# Patient Record
Sex: Female | Born: 1955 | Race: White | Hispanic: No | Marital: Single | Smoking: Former smoker
Health system: Southern US, Community
[De-identification: ages and names within clinical notes are randomized; demographics above are authoritative.]

## PROBLEM LIST (undated history)

## (undated) DIAGNOSIS — K146 Glossodynia: Secondary | ICD-10-CM

## (undated) DIAGNOSIS — I7381 Erythromelalgia: Secondary | ICD-10-CM

## (undated) DIAGNOSIS — M069 Rheumatoid arthritis, unspecified: Secondary | ICD-10-CM

## (undated) DIAGNOSIS — M797 Fibromyalgia: Secondary | ICD-10-CM

## (undated) HISTORY — PX: WRIST SURGERY: SHX841

## (undated) HISTORY — PX: FOOT SURGERY: SHX648

## (undated) HISTORY — DX: Erythromelalgia: I73.81

## (undated) HISTORY — PX: HERNIA REPAIR: SHX51

## (undated) HISTORY — PX: AUGMENTATION MAMMAPLASTY: SUR837

---

## 2014-04-08 DIAGNOSIS — F21 Schizotypal disorder: Secondary | ICD-10-CM | POA: Insufficient documentation

## 2014-04-08 DIAGNOSIS — F4010 Social phobia, unspecified: Secondary | ICD-10-CM | POA: Insufficient documentation

## 2015-11-27 DIAGNOSIS — F900 Attention-deficit hyperactivity disorder, predominantly inattentive type: Secondary | ICD-10-CM | POA: Insufficient documentation

## 2016-07-26 ENCOUNTER — Other Ambulatory Visit: Payer: Self-pay

## 2016-07-26 ENCOUNTER — Encounter (HOSPITAL_COMMUNITY): Payer: Self-pay | Admitting: *Deleted

## 2016-07-26 ENCOUNTER — Emergency Department (HOSPITAL_COMMUNITY): Payer: Medicare Other

## 2016-07-26 ENCOUNTER — Emergency Department (HOSPITAL_COMMUNITY)
Admission: EM | Admit: 2016-07-26 | Discharge: 2016-07-26 | Disposition: A | Discharge status: Home / Self Care | Payer: Medicare Other | Attending: Emergency Medicine | Admitting: Emergency Medicine

## 2016-07-26 DIAGNOSIS — F17200 Nicotine dependence, unspecified, uncomplicated: Secondary | ICD-10-CM | POA: Insufficient documentation

## 2016-07-26 DIAGNOSIS — R51 Headache: Secondary | ICD-10-CM | POA: Insufficient documentation

## 2016-07-26 DIAGNOSIS — Z5181 Encounter for therapeutic drug level monitoring: Secondary | ICD-10-CM | POA: Insufficient documentation

## 2016-07-26 DIAGNOSIS — Z79899 Other long term (current) drug therapy: Secondary | ICD-10-CM | POA: Diagnosis not present

## 2016-07-26 DIAGNOSIS — R519 Headache, unspecified: Secondary | ICD-10-CM

## 2016-07-26 HISTORY — DX: Fibromyalgia: M79.7

## 2016-07-26 HISTORY — DX: Rheumatoid arthritis, unspecified: M06.9

## 2016-07-26 HISTORY — DX: Glossodynia: K14.6

## 2016-07-26 LAB — I-STAT TROPONIN, ED: Troponin i, poc: 0 ng/mL (ref 0.00–0.08)

## 2016-07-26 LAB — DIFFERENTIAL
Basophils Absolute: 0 10*3/uL (ref 0.0–0.1)
Basophils Relative: 0 %
Eosinophils Absolute: 0.1 10*3/uL (ref 0.0–0.7)
Eosinophils Relative: 1 %
LYMPHS ABS: 2.6 10*3/uL (ref 0.7–4.0)
LYMPHS PCT: 40 %
Monocytes Absolute: 0.4 10*3/uL (ref 0.1–1.0)
Monocytes Relative: 6 %
NEUTROS ABS: 3.5 10*3/uL (ref 1.7–7.7)
NEUTROS PCT: 53 %

## 2016-07-26 LAB — COMPREHENSIVE METABOLIC PANEL
ALBUMIN: 4.1 g/dL (ref 3.5–5.0)
ALK PHOS: 139 U/L — AB (ref 38–126)
ALT: 21 U/L (ref 14–54)
AST: 23 U/L (ref 15–41)
Anion gap: 7 (ref 5–15)
BILIRUBIN TOTAL: 0.6 mg/dL (ref 0.3–1.2)
BUN: 15 mg/dL (ref 6–20)
CALCIUM: 9.6 mg/dL (ref 8.9–10.3)
CO2: 29 mmol/L (ref 22–32)
CREATININE: 0.59 mg/dL (ref 0.44–1.00)
Chloride: 106 mmol/L (ref 101–111)
GFR calc Af Amer: >60 mL/min (ref >60)
GFR calc non Af Amer: >60 mL/min (ref >60)
GLUCOSE: 105 mg/dL — AB (ref 65–99)
Potassium: 4.8 mmol/L (ref 3.5–5.1)
SODIUM: 142 mmol/L (ref 135–145)
Total Protein: 6.6 g/dL (ref 6.5–8.1)

## 2016-07-26 LAB — PROTIME-INR
INR: 0.91
PROTHROMBIN TIME: 12.2 s (ref 11.4–15.2)

## 2016-07-26 LAB — CBC
HCT: 43.3 % (ref 36.0–46.0)
HEMOGLOBIN: 14.2 g/dL (ref 12.0–15.0)
MCH: 32.9 pg (ref 26.0–34.0)
MCHC: 32.8 g/dL (ref 30.0–36.0)
MCV: 100.2 fL — AB (ref 78.0–100.0)
PLATELETS: 304 10*3/uL (ref 150–400)
RBC: 4.32 MIL/uL (ref 3.87–5.11)
RDW: 14.5 % (ref 11.5–15.5)
WBC: 6.6 10*3/uL (ref 4.0–10.5)

## 2016-07-26 LAB — CBG MONITORING, ED: Glucose-Capillary: 88 mg/dL (ref 65–99)

## 2016-07-26 LAB — POC URINE PREG, ED: PREG TEST UR: NEGATIVE

## 2016-07-26 LAB — APTT: aPTT: 29 seconds (ref 24–36)

## 2016-07-26 MED ORDER — DIPHENHYDRAMINE HCL 50 MG/ML IJ SOLN
25.0000 mg | Freq: Once | INTRAMUSCULAR | Status: AC
  Administered 2016-07-26: 25 mg via INTRAVENOUS
  Filled 2016-07-26: qty 1

## 2016-07-26 MED ORDER — SODIUM CHLORIDE 0.9 % IV BOLUS (SEPSIS)
1000.0000 mL | Freq: Once | INTRAVENOUS | Status: AC
  Administered 2016-07-26: 1000 mL via INTRAVENOUS

## 2016-07-26 MED ORDER — KETOROLAC TROMETHAMINE 15 MG/ML IJ SOLN
15.0000 mg | Freq: Once | INTRAMUSCULAR | Status: AC
  Administered 2016-07-26: 15 mg via INTRAVENOUS
  Filled 2016-07-26: qty 1

## 2016-07-26 MED ORDER — METOCLOPRAMIDE HCL 5 MG/ML IJ SOLN
10.0000 mg | Freq: Once | INTRAMUSCULAR | Status: AC
  Administered 2016-07-26: 10 mg via INTRAVENOUS
  Filled 2016-07-26: qty 2

## 2016-07-26 NOTE — ED Notes (Signed)
On way to CT 

## 2016-07-26 NOTE — ED Triage Notes (Addendum)
PT recently moved here and is here with headache for the last 5-7 days and feels like pain shifts from one side to the other.  Pt states she had some intermittent dizziness and sensitive to light and noise.  NO extremity or facial deficits.  Pt needs specific labs drawn so she can get her medications since she just moved here.  Feels like when she is walking her gait is off

## 2016-07-26 NOTE — ED Provider Notes (Signed)
[facility] DEPT Provider Note   CSN: [id number] Arrival date & time: 07/26/16  1033     History   Chief Complaint Chief Complaint  Patient presents with  . Headache  . Dizziness    HPI Krista Harris is a 61 y.o. female.  The history is provided by the patient.  Headache   This is a new problem. Episode onset: about 1 week. The problem occurs constantly. The problem has not changed since onset.The headache is associated with nothing. Pain location: "moving all around and feels like there is sloshing in my head" The quality of the pain is described as dull and throbbing. The pain is moderate. The pain does not radiate. Pertinent negatives include no fever, no near-syncope, no shortness of breath, no nausea and no vomiting. She has tried nothing for the symptoms. The treatment provided no relief.    Past Medical History:  Diagnosis Date  . BMS (burning mouth syndrome)   . Fibromyalgia   . Rheumatoid arthritis ([place])     There are no active problems to display for this patient.   Past Surgical History:  Procedure Laterality Date  . WRIST SURGERY      OB History    No data available       Home Medications    Prior to Admission medications   Medication Sig Start Date End Date Taking? Authorizing Provider  acetaminophen (TYLENOL) 325 MG tablet Take 650 mg by mouth every 6 (six) hours as needed for mild pain.   Yes [provider]  Cholecalciferol (VITAMIN D) 2000 units tablet Take 2,000 Units by mouth daily. 07/19/16  Yes [provider]  clonazePAM (KLONOPIN) 0.5 MG tablet Take 0.5 mg by mouth 2 (two) times daily as needed for anxiety. Burning mouth 06/23/16  Yes [provider]  folic acid (FOLVITE) 1 MG tablet Take 1 mg by mouth daily.   Yes [provider]  gabapentin (NEURONTIN) 600 MG tablet Take 600 mg by mouth 3 (three) times daily. 06/23/16  Yes [provider]  methotrexate (RHEUMATREX) 2.5 MG tablet Take 20 mg  by mouth once a week. Wednesdays 07/19/16  Yes [provider]  predniSONE (DELTASONE) 5 MG tablet Take 10 mg by mouth daily.  07/18/16  Yes [provider]    Family History No family history on file.  Social History Social History  Substance Use Topics  . Smoking status: Current Every Day Smoker  . Smokeless tobacco: Never Used  . Alcohol use No     Allergies   Patient has no known allergies.   Review of Systems Review of Systems  Constitutional: Negative for fever.  HENT: Negative.   Respiratory: Negative for shortness of breath.   Cardiovascular: Negative for chest pain and near-syncope.  Gastrointestinal: Negative for nausea and vomiting.  Genitourinary: Negative.   Neurological: Positive for headaches.  All other systems reviewed and are negative.    Physical Exam Updated Vital Signs BP 117/73   Pulse 71   Temp 97.6 F (36.4 C) (Oral)   Resp 13   SpO2 95%   Physical Exam  Constitutional: She is oriented to person, place, and time. She appears well-developed and well-nourished. No distress.  HENT:  Head: Normocephalic and atraumatic.  Mouth/Throat: Oropharynx is clear and moist.  Eyes: Conjunctivae and EOM are normal. Pupils are equal, round, and reactive to light.  Neck: Normal range of motion. Neck supple.  Cardiovascular: Normal rate and regular rhythm.   No murmur heard. Pulmonary/Chest:  Effort normal and breath sounds normal. No respiratory distress.  Abdominal: Soft. There is no tenderness.  Musculoskeletal: She exhibits no edema or deformity.  Neurological: She is alert and oriented to person, place, and time. She has normal strength. No cranial nerve deficit or sensory deficit. She exhibits normal muscle tone. Coordination normal. GCS eye subscore is 4. GCS verbal subscore is 5. GCS motor subscore is 6.  Skin: Skin is warm and dry.  Psychiatric: She has a normal mood and affect.  Nursing note and vitals reviewed.    ED  Treatments / Results  Labs (all labs ordered are listed, but only abnormal results are displayed) Labs Reviewed  CBC - Abnormal; Notable for the following:       Result Value   MCV 100.2 (*)    All other components within normal limits  COMPREHENSIVE METABOLIC PANEL - Abnormal; Notable for the following:    Glucose, Bld 105 (*)    Alkaline Phosphatase 139 (*)    All other components within normal limits  PROTIME-INR  APTT  DIFFERENTIAL  I-STAT TROPOININ, ED  CBG MONITORING, ED  I-STAT CHEM 8, ED  POC URINE PREG, ED    EKG  EKG Interpretation None       Radiology Ct Head Wo Contrast  Result Date: 07/26/2016 CLINICAL DATA:  Intermittent dizziness and sensitivity to light and weight, headaches. EXAM: CT HEAD WITHOUT CONTRAST TECHNIQUE: Contiguous axial images were obtained from the base of the skull through the vertex without intravenous contrast. COMPARISON:  None. FINDINGS: Brain: No evidence of acute infarction, hemorrhage, hydrocephalus, extra-axial collection or mass lesion/mass effect. Vascular: No hyperdense vessel or unexpected calcification. Skull: Normal. Negative for fracture or focal lesion. Sinuses/Orbits: No acute finding. Other: None. IMPRESSION: No focal acute intracranial abnormality identified. Electronically Signed   By: Abelardo Diesel M.D.   On: 07/26/2016 17:59    Procedures Procedures (including critical care time)  Medications Ordered in ED Medications  sodium chloride 0.9 % bolus 1,000 mL (0 mLs Intravenous Stopped 07/26/16 1731)  metoCLOPramide (REGLAN) injection 10 mg (10 mg Intravenous Given 07/26/16 1621)  diphenhydrAMINE (BENADRYL) injection 25 mg (25 mg Intravenous Given 07/26/16 1621)  ketorolac (TORADOL) 15 MG/ML injection 15 mg (15 mg Intravenous Given 07/26/16 1621)     Initial Impression / Assessment and Plan / ED Course  I have reviewed the triage vital signs and the nursing notes.  Pertinent labs & imaging results that were available during my  care of the patient were reviewed by me and considered in my medical decision making (see chart for details).     Patient is a 61 year old female who presents with headache for the past one week. No history of headaches in the past. Vital signs are unremarkable exam without acute neurologic findings. No suspicion for infection at this time. No stroke findings. Given no history of headaches or get a head CT. Migraine cocktail given with complete resolution of pain. Labs unremarkable and head CT unremarkable.  I have reviewed all labs and imaging. Patient stable for discharge home.  I have reviewed all results with the patient. Advised to f/u with a pcp within 1 week. Patient agrees to stated plan. All questions answered. Advised to call or return to have any questions, new symptoms, change in symptoms, or symptoms that they do not understand.   Final Clinical Impressions(s) / ED Diagnoses   Final diagnoses:  Nonintractable headache, unspecified chronicity pattern, unspecified headache type    New Prescriptions Discharge Medication List as  of 07/26/2016  6:21 PM       Heriberto Antigua, MD 07/27/16 2575    Veryl Speak, MD 07/27/16 1544

## 2016-11-30 ENCOUNTER — Other Ambulatory Visit (HOSPITAL_COMMUNITY): Payer: Self-pay | Admitting: Internal Medicine

## 2016-11-30 DIAGNOSIS — M069 Rheumatoid arthritis, unspecified: Secondary | ICD-10-CM

## 2016-12-01 ENCOUNTER — Ambulatory Visit (HOSPITAL_COMMUNITY)
Admission: RE | Admit: 2016-12-01 | Discharge: 2016-12-01 | Disposition: A | Discharge status: Home / Self Care | Payer: Medicare Other | Source: Ambulatory Visit | Attending: Internal Medicine | Admitting: Internal Medicine

## 2016-12-01 ENCOUNTER — Encounter (HOSPITAL_COMMUNITY): Payer: Self-pay | Admitting: Radiology

## 2016-12-01 DIAGNOSIS — M1288 Other specific arthropathies, not elsewhere classified, other specified site: Secondary | ICD-10-CM | POA: Insufficient documentation

## 2016-12-01 DIAGNOSIS — M069 Rheumatoid arthritis, unspecified: Secondary | ICD-10-CM | POA: Insufficient documentation

## 2017-01-13 ENCOUNTER — Emergency Department (HOSPITAL_COMMUNITY)
Admission: EM | Admit: 2017-01-13 | Discharge: 2017-01-13 | Disposition: A | Discharge status: Home / Self Care | Payer: Medicare Other | Attending: Emergency Medicine | Admitting: Emergency Medicine

## 2017-01-13 ENCOUNTER — Encounter (HOSPITAL_COMMUNITY): Payer: Self-pay | Admitting: Emergency Medicine

## 2017-01-13 DIAGNOSIS — Z79899 Other long term (current) drug therapy: Secondary | ICD-10-CM | POA: Diagnosis not present

## 2017-01-13 DIAGNOSIS — F17210 Nicotine dependence, cigarettes, uncomplicated: Secondary | ICD-10-CM | POA: Diagnosis not present

## 2017-01-13 DIAGNOSIS — Y929 Unspecified place or not applicable: Secondary | ICD-10-CM | POA: Insufficient documentation

## 2017-01-13 DIAGNOSIS — S3992XA Unspecified injury of lower back, initial encounter: Secondary | ICD-10-CM | POA: Diagnosis present

## 2017-01-13 DIAGNOSIS — M545 Low back pain, unspecified: Secondary | ICD-10-CM

## 2017-01-13 DIAGNOSIS — X500XXA Overexertion from strenuous movement or load, initial encounter: Secondary | ICD-10-CM | POA: Insufficient documentation

## 2017-01-13 DIAGNOSIS — Y9389 Activity, other specified: Secondary | ICD-10-CM | POA: Diagnosis not present

## 2017-01-13 DIAGNOSIS — Y999 Unspecified external cause status: Secondary | ICD-10-CM | POA: Insufficient documentation

## 2017-01-13 DIAGNOSIS — S39012A Strain of muscle, fascia and tendon of lower back, initial encounter: Secondary | ICD-10-CM | POA: Diagnosis not present

## 2017-01-13 MED ORDER — DEXAMETHASONE 4 MG PO TABS: 4.0000 mg | ORAL_TABLET | Freq: Two times a day (BID) | ORAL | 0 refills | Status: DC

## 2017-01-13 MED ORDER — DICLOFENAC SODIUM 75 MG PO TBEC
75.0000 mg | DELAYED_RELEASE_TABLET | Freq: Two times a day (BID) | ORAL | 0 refills | Status: DC
Start: 2017-01-13 — End: 2017-04-05

## 2017-01-13 MED ORDER — CYCLOBENZAPRINE HCL 10 MG PO TABS: 10.0000 mg | ORAL_TABLET | Freq: Three times a day (TID) | ORAL | 0 refills | Status: DC

## 2017-01-13 NOTE — ED Provider Notes (Signed)
[facility] EMERGENCY DEPARTMENT Provider Note   CSN: [id number] Arrival date & time: 01/13/17  1414     History   Chief Complaint Chief Complaint  Patient presents with  . Back Pain    HPI Krista Harris is a 61 y.o. female.  Patient is a 61 year old female who presents to the emergency department with a complaint of lower back area pain.  The patient states that approximately 1 month ago she was doing some moving, she thinks she pulled something in her back.  She has been having problems with her back since that time.  The patient states that she has rheumatoid arthritis as well as fibromyalgia and she has some "brittle bones" .  Patient states that the pain seems to be getting worse instead of getting better in spite of conservative measures.  She does not have a primary physician as she is recently moved into this area.  She presents to the emergency department for assistance and evaluation.   The history is provided by the patient.  Back Pain   Pertinent negatives include no chest pain, no abdominal pain and no dysuria.    Past Medical History:  Diagnosis Date  . BMS (burning mouth syndrome)   . Fibromyalgia   . Rheumatoid arthritis ([place])     There are no active problems to display for this patient.   Past Surgical History:  Procedure Laterality Date  . WRIST SURGERY      OB History    No data available       Home Medications    Prior to Admission medications   Medication Sig Start Date End Date Taking? Authorizing Provider  acetaminophen (TYLENOL) 325 MG tablet Take 650 mg by mouth every 6 (six) hours as needed for mild pain.    [provider]  Cholecalciferol (VITAMIN D) 2000 units tablet Take 2,000 Units by mouth daily. 07/19/16   [provider]  clonazePAM (KLONOPIN) 0.5 MG tablet Take 0.5 mg by mouth 2 (two) times daily as needed for anxiety. Burning mouth 06/23/16   [provider]  folic acid (FOLVITE) 1 MG tablet  Take 1 mg by mouth daily.    [provider]  gabapentin (NEURONTIN) 600 MG tablet Take 600 mg by mouth 3 (three) times daily. 06/23/16   [provider]  methotrexate (RHEUMATREX) 2.5 MG tablet Take 20 mg by mouth once a week. Wednesdays 07/19/16   [provider]  predniSONE (DELTASONE) 5 MG tablet Take 10 mg by mouth daily.  07/18/16   [provider]    Family History No family history on file.  Social History Social History  Substance Use Topics  . Smoking status: Current Every Day Smoker    Packs/day: 0.50    Types: Cigarettes  . Smokeless tobacco: Never Used  . Alcohol use No     Allergies   Patient has no known allergies.   Review of Systems Review of Systems  Constitutional: Negative for activity change.       All ROS Neg except as noted in HPI  HENT: Negative for nosebleeds.   Eyes: Negative for photophobia and discharge.  Respiratory: Negative for cough, shortness of breath and wheezing.   Cardiovascular: Negative for chest pain and palpitations.  Gastrointestinal: Negative for abdominal pain and blood in stool.  Genitourinary: Negative for dysuria, frequency and hematuria.  Musculoskeletal: Positive for arthralgias and back pain. Negative for neck pain.  Skin: Negative.   Neurological: Negative for dizziness, seizures and  speech difficulty.  Psychiatric/Behavioral: Negative for confusion and hallucinations.     Physical Exam Updated Vital Signs BP 119/79 (BP Location: Right Arm)   Pulse 82   Temp 97.6 F (36.4 C) (Oral)   Resp 15   Ht 5\' 3"  (1.6 m)   Wt 48.5 kg (107 lb)   SpO2 100%   BMI 18.95 kg/m   Physical Exam  Constitutional: She is oriented to person, place, and time. She appears well-developed and well-nourished.  Non-toxic appearance.  HENT:  Head: Normocephalic.  Right Ear: Tympanic membrane and external ear normal.  Left Ear: Tympanic membrane and external ear normal.  Eyes: Pupils are equal, round, and  reactive to light. EOM and lids are normal.  Neck: Normal range of motion. Neck supple. Carotid bruit is not present.  Cardiovascular: Normal rate, regular rhythm, normal heart sounds, intact distal pulses and normal pulses.   Pulmonary/Chest: Breath sounds normal. No respiratory distress.  Abdominal: Soft. Bowel sounds are normal. There is no tenderness. There is no guarding.  Musculoskeletal: Normal range of motion.       Lumbar back: She exhibits pain and spasm.       Back:  Patient has a brace on the right hand and wrist area.  Lymphadenopathy:       Head (right side): No submandibular adenopathy present.       Head (left side): No submandibular adenopathy present.    She has no cervical adenopathy.  Neurological: She is alert and oriented to person, place, and time. She has normal strength. No cranial nerve deficit or sensory deficit.  Gait is steady.  No foot drop noted.  No motor or sensory deficits appreciated of the lower extremity.  Skin: Skin is warm and dry.  Psychiatric: She has a normal mood and affect. Her speech is normal.  Nursing note and vitals reviewed.    ED Treatments / Results  Labs (all labs ordered are listed, but only abnormal results are displayed) Labs Reviewed - No data to display  EKG  EKG Interpretation None       Radiology No results found.  Procedures Procedures (including critical care time)  Medications Ordered in ED Medications - No data to display   Initial Impression / Assessment and Plan / ED Course  I have reviewed the triage vital signs and the nursing notes.  Pertinent labs & imaging results that were available during my care of the patient were reviewed by me and considered in my medical decision making (see chart for details).       Final Clinical Impressions(s) / ED Diagnoses MDM Vital signs within normal limits.  No gross neurologic deficits appreciated at this time.  Patient states that some years ago she thinks she  was told that she had some degenerative disc disease changes.  The examination today favors some muscle strain, as well as some musculoskeletal pain in the lumbar region.  The patient will be treated with Flexeril, Decadron, and diclofenac.  Patient is referred to orthopedics for additional treatment and diagnosis.   Final diagnoses:  Strain of lumbar region, initial encounter  Midline low back pain without sciatica, unspecified chronicity    New Prescriptions New Prescriptions   CYCLOBENZAPRINE (FLEXERIL) 10 MG TABLET    Take 1 tablet (10 mg total) by mouth 3 (three) times daily.   DEXAMETHASONE (DECADRON) 4 MG TABLET    Take 1 tablet (4 mg total) by mouth 2 (two) times daily with a meal.   DICLOFENAC (VOLTAREN) 75  MG EC TABLET    Take 1 tablet (75 mg total) by mouth 2 (two) times daily.     Lily Kocher, PA-C 01/13/17 [place], [place], PA-C 01/13/17 1513    Fredia Sorrow, MD 01/14/17 [phone]

## 2017-01-13 NOTE — ED Triage Notes (Signed)
Patient complains of lower back pain after dumping over a barrel x 1 month ago. She states the pain is radiating up right side of lower back.

## 2017-01-13 NOTE — Discharge Instructions (Signed)
Your vital signs are within normal limits at this time.  Your neurologic examination is normal and does not reveal any deficits at this time.  The examination of your lower back reveals pain with various movements and palpation.  Please use a heating pad to your lower back when at rest.  Please use Flexeril, Decadron, and diclofenac daily.  Please take the Decadron and diclofenac with food.  Flexeril may cause drowsiness.  Please do not drive, operate machinery, lift heavy objects, drink alcohol, or participate in activities requiring concentration when taking this medication.  Please see Dr. Percell Miller or member of his team for further evaluation and management of your back issue.

## 2017-03-23 ENCOUNTER — Other Ambulatory Visit: Payer: Self-pay

## 2017-03-23 ENCOUNTER — Encounter (HOSPITAL_COMMUNITY): Payer: Self-pay | Admitting: *Deleted

## 2017-03-23 ENCOUNTER — Emergency Department (HOSPITAL_COMMUNITY)
Admission: EM | Admit: 2017-03-23 | Discharge: 2017-03-23 | Disposition: A | Discharge status: Home / Self Care | Payer: Medicare Other | Attending: Emergency Medicine | Admitting: Emergency Medicine

## 2017-03-23 DIAGNOSIS — F17210 Nicotine dependence, cigarettes, uncomplicated: Secondary | ICD-10-CM | POA: Insufficient documentation

## 2017-03-23 DIAGNOSIS — Z79899 Other long term (current) drug therapy: Secondary | ICD-10-CM | POA: Insufficient documentation

## 2017-03-23 DIAGNOSIS — R197 Diarrhea, unspecified: Secondary | ICD-10-CM | POA: Insufficient documentation

## 2017-03-23 LAB — COMPREHENSIVE METABOLIC PANEL
ALT: 21 U/L (ref 14–54)
AST: 23 U/L (ref 15–41)
Albumin: 4.2 g/dL (ref 3.5–5.0)
Alkaline Phosphatase: 115 U/L (ref 38–126)
Anion gap: 12 (ref 5–15)
BILIRUBIN TOTAL: 0.7 mg/dL (ref 0.3–1.2)
BUN: 14 mg/dL (ref 6–20)
CHLORIDE: 106 mmol/L (ref 101–111)
CO2: 25 mmol/L (ref 22–32)
CREATININE: 0.56 mg/dL (ref 0.44–1.00)
Calcium: 9.7 mg/dL (ref 8.9–10.3)
Glucose, Bld: 95 mg/dL (ref 65–99)
POTASSIUM: 4.2 mmol/L (ref 3.5–5.1)
Sodium: 143 mmol/L (ref 135–145)
TOTAL PROTEIN: 7.4 g/dL (ref 6.5–8.1)

## 2017-03-23 LAB — CBC
HEMATOCRIT: 45 % (ref 36.0–46.0)
Hemoglobin: 14.3 g/dL (ref 12.0–15.0)
MCH: 32.9 pg (ref 26.0–34.0)
MCHC: 31.8 g/dL (ref 30.0–36.0)
MCV: 103.7 fL — ABNORMAL HIGH (ref 78.0–100.0)
PLATELETS: 331 10*3/uL (ref 150–400)
RBC: 4.34 MIL/uL (ref 3.87–5.11)
RDW: 13.3 % (ref 11.5–15.5)
WBC: 6.8 10*3/uL (ref 4.0–10.5)

## 2017-03-23 LAB — URINALYSIS, ROUTINE W REFLEX MICROSCOPIC
Bilirubin Urine: NEGATIVE
GLUCOSE, UA: NEGATIVE mg/dL
Hgb urine dipstick: NEGATIVE
Ketones, ur: NEGATIVE mg/dL
LEUKOCYTES UA: NEGATIVE
NITRITE: NEGATIVE
Protein, ur: NEGATIVE mg/dL
Specific Gravity, Urine: 1.011 (ref 1.005–1.030)
pH: 5 (ref 5.0–8.0)

## 2017-03-23 LAB — LIPASE, BLOOD: LIPASE: 28 U/L (ref 11–51)

## 2017-03-23 MED ORDER — DICYCLOMINE HCL 20 MG PO TABS: 20.0000 mg | ORAL_TABLET | Freq: Two times a day (BID) | ORAL | 0 refills | Status: DC

## 2017-03-23 NOTE — ED Triage Notes (Signed)
Pt c/o watery diarrhea since June. Pt reports she was put on some medicine back in June for colitis and possible E. Coli infection. Pt recently moved down to [place] and is trying to get in with GI in this area. Pt reports 35 episodes of diarrhea in the last 24 hours, some very small and some larger per pt. Pt reports chills.

## 2017-03-24 NOTE — ED Provider Notes (Signed)
[facility] EMERGENCY DEPARTMENT Provider Note   CSN: [id number] Arrival date & time: 03/23/17  1343     History   Chief Complaint Chief Complaint  Patient presents with  . Diarrhea    HPI Krista Harris is a 62 y.o. female.   Diarrhea   This is a chronic problem. The current episode started 2 days ago. The problem occurs more than 10 times per day. The problem has not changed since onset.The stool consistency is described as watery and mucous. There has been no fever. Associated symptoms include abdominal pain. Pertinent negatives include no vomiting, no chills and no cough. She has tried nothing for the symptoms.    Past Medical History:  Diagnosis Date  . BMS (burning mouth syndrome)   . Fibromyalgia   . Rheumatoid arthritis ([place])     There are no active problems to display for this patient.   Past Surgical History:  Procedure Laterality Date  . FOOT SURGERY Bilateral    for rheumatoid arthritis  . HERNIA REPAIR    . WRIST SURGERY      OB History    No data available       Home Medications    Prior to Admission medications   Medication Sig Start Date End Date Taking? Authorizing Provider  acetaminophen (TYLENOL) 325 MG tablet Take 650 mg by mouth every 6 (six) hours as needed for mild pain.    [provider]  Cholecalciferol (VITAMIN D) 2000 units tablet Take 2,000 Units by mouth daily. 07/19/16   [provider]  clonazePAM (KLONOPIN) 0.5 MG tablet Take 0.5 mg by mouth 2 (two) times daily as needed for anxiety. Burning mouth 06/23/16   [provider]  cyclobenzaprine (FLEXERIL) 10 MG tablet Take 1 tablet (10 mg total) by mouth 3 (three) times daily. 01/13/17   Lily Kocher, PA-C  dexamethasone (DECADRON) 4 MG tablet Take 1 tablet (4 mg total) by mouth 2 (two) times daily with a meal. 01/13/17   Lily Kocher, PA-C  diclofenac (VOLTAREN) 75 MG EC tablet Take 1 tablet (75 mg total) by mouth 2 (two) times daily. 01/13/17    Lily Kocher, PA-C  dicyclomine (BENTYL) 20 MG tablet Take 1 tablet (20 mg total) by mouth 2 (two) times daily. 03/23/17   Sairah Knobloch, Corene Cornea, MD  folic acid (FOLVITE) 1 MG tablet Take 1 mg by mouth daily.    [provider]  gabapentin (NEURONTIN) 600 MG tablet Take 600 mg by mouth 3 (three) times daily. 06/23/16   [provider]  methotrexate (RHEUMATREX) 2.5 MG tablet Take 20 mg by mouth once a week. Wednesdays 07/19/16   [provider]  predniSONE (DELTASONE) 5 MG tablet Take 10 mg by mouth daily.  07/18/16   [provider]    Family History No family history on file.  Social History Social History   Tobacco Use  . Smoking status: Current Every Day Smoker    Packs/day: 0.50    Types: Cigarettes  . Smokeless tobacco: Never Used  Substance Use Topics  . Alcohol use: No  . Drug use: No     Allergies   Patient has no known allergies.   Review of Systems Review of Systems  Constitutional: Negative for chills.  Respiratory: Negative for cough.   Gastrointestinal: Positive for abdominal pain and diarrhea. Negative for vomiting.  All other systems reviewed and are negative.    Physical Exam Updated Vital Signs BP 114/71 (BP Location: Left Arm)   Pulse  93   Temp 97.8 F (36.6 C) (Oral)   Resp 18   Ht 5\' 4"  (1.626 m)   Wt 48.1 kg (106 lb)   SpO2 99%   BMI 18.19 kg/m   Physical Exam  Constitutional: She appears well-developed and well-nourished.  HENT:  Head: Normocephalic and atraumatic.  Eyes: Conjunctivae and EOM are normal.  Neck: Normal range of motion.  Cardiovascular: Normal rate and regular rhythm.  Pulmonary/Chest: No stridor. No respiratory distress.  Abdominal: Soft. Bowel sounds are normal. She exhibits no distension. There is no tenderness. There is no guarding.  Neurological: She is alert.  Skin: Skin is warm and dry.  Nursing note and vitals reviewed.    ED Treatments / Results  Labs (all labs ordered are  listed, but only abnormal results are displayed) Labs Reviewed  CBC - Abnormal; Notable for the following components:      Result Value   MCV 103.7 (*)    All other components within normal limits  GASTROINTESTINAL PANEL BY PCR, STOOL (REPLACES STOOL CULTURE)  C DIFFICILE QUICK SCREEN W PCR REFLEX  LIPASE, BLOOD  COMPREHENSIVE METABOLIC PANEL  URINALYSIS, ROUTINE W REFLEX MICROSCOPIC    EKG  EKG Interpretation None       Radiology No results found.  Procedures Procedures (including critical care time)  Medications Ordered in ED Medications - No data to display   Initial Impression / Assessment and Plan / ED Course  I have reviewed the triage vital signs and the nursing notes.  Pertinent labs & imaging results that were available during my care of the patient were reviewed by me and considered in my medical decision making (see chart for details).     Acute on chronic diarrheal attack. No e/o infection, electrolyte abnormality or dehydration to require further workup/mangement in ED so will dc to fu w/ GI. GI pathogen panel pending.   Final Clinical Impressions(s) / ED Diagnoses   Final diagnoses:  Diarrhea, unspecified type    ED Discharge Orders        Ordered    dicyclomine (BENTYL) 20 MG tablet  2 times daily     03/23/17 2237       Emari Demmer, Corene Cornea, MD 03/24/17 [phone]

## 2017-03-25 ENCOUNTER — Other Ambulatory Visit (HOSPITAL_COMMUNITY)
Admission: RE | Admit: 2017-03-25 | Discharge: 2017-03-25 | Disposition: A | Discharge status: Home / Self Care | Payer: Medicare Other | Source: Other Acute Inpatient Hospital | Attending: Emergency Medicine | Admitting: Emergency Medicine

## 2017-03-25 DIAGNOSIS — R197 Diarrhea, unspecified: Secondary | ICD-10-CM | POA: Diagnosis present

## 2017-03-26 LAB — GASTROINTESTINAL PANEL BY PCR, STOOL (REPLACES STOOL CULTURE)
ADENOVIRUS F40/41: NOT DETECTED
ASTROVIRUS: NOT DETECTED
CAMPYLOBACTER SPECIES: NOT DETECTED
CRYPTOSPORIDIUM: NOT DETECTED
CYCLOSPORA CAYETANENSIS: NOT DETECTED
ENTAMOEBA HISTOLYTICA: NOT DETECTED
ENTEROTOXIGENIC E COLI (ETEC): NOT DETECTED
Enteroaggregative E coli (EAEC): NOT DETECTED
Enteropathogenic E coli (EPEC): NOT DETECTED
Giardia lamblia: NOT DETECTED
Norovirus GI/GII: NOT DETECTED
PLESIMONAS SHIGELLOIDES: NOT DETECTED
Rotavirus A: NOT DETECTED
SAPOVIRUS (I, II, IV, AND V): NOT DETECTED
SHIGA LIKE TOXIN PRODUCING E COLI (STEC): NOT DETECTED
Salmonella species: NOT DETECTED
Shigella/Enteroinvasive E coli (EIEC): NOT DETECTED
VIBRIO CHOLERAE: NOT DETECTED
VIBRIO SPECIES: NOT DETECTED
YERSINIA ENTEROCOLITICA: NOT DETECTED

## 2017-04-05 ENCOUNTER — Telehealth (INDEPENDENT_AMBULATORY_CARE_PROVIDER_SITE_OTHER): Payer: Self-pay | Admitting: Internal Medicine

## 2017-04-05 ENCOUNTER — Telehealth (INDEPENDENT_AMBULATORY_CARE_PROVIDER_SITE_OTHER): Payer: Self-pay | Admitting: *Deleted

## 2017-04-05 ENCOUNTER — Ambulatory Visit (INDEPENDENT_AMBULATORY_CARE_PROVIDER_SITE_OTHER): Payer: Medicare Other | Admitting: Internal Medicine

## 2017-04-05 ENCOUNTER — Encounter (INDEPENDENT_AMBULATORY_CARE_PROVIDER_SITE_OTHER): Payer: Self-pay | Admitting: *Deleted

## 2017-04-05 ENCOUNTER — Encounter (INDEPENDENT_AMBULATORY_CARE_PROVIDER_SITE_OTHER): Payer: Self-pay | Admitting: Internal Medicine

## 2017-04-05 VITALS — BP 122/78 | HR 72 | Temp 97.5°F | Ht 64.0 in | Wt 104.5 lb

## 2017-04-05 DIAGNOSIS — R197 Diarrhea, unspecified: Secondary | ICD-10-CM | POA: Diagnosis not present

## 2017-04-05 DIAGNOSIS — Z860100 Personal history of colon polyps, unspecified: Secondary | ICD-10-CM

## 2017-04-05 MED ORDER — PEG 3350-KCL-NA BICARB-NACL 420 G PO SOLR: 4000.0000 mL | Freq: Once | ORAL | 0 refills | Status: AC

## 2017-04-05 MED ORDER — DICYCLOMINE HCL 10 MG PO CAPS: 10.0000 mg | ORAL_CAPSULE | Freq: Every day | ORAL | 3 refills | Status: DC

## 2017-04-05 NOTE — Patient Instructions (Addendum)
Colonoscopy. The risks of bleeding, perforation and infection were reviewed with patient. Diet for Irritable Bowel Syndrome When you have irritable bowel syndrome (IBS), the foods you eat and your eating habits are very important. IBS may cause various symptoms, such as abdominal pain, constipation, or diarrhea. Choosing the right foods can help ease discomfort caused by these symptoms. Work with your health care provider and dietitian to find the best eating plan to help control your symptoms. What general guidelines do I need to follow?  Keep a food diary. This will help you identify foods that cause symptoms. Write down: ? What you eat and when. ? What symptoms you have. ? When symptoms occur in relation to your meals.  Avoid foods that cause symptoms. Talk with your dietitian about other ways to get the same nutrients that are in these foods.  Eat more foods that contain fiber. Take a fiber supplement if directed by your dietitian.  Eat your meals slowly, in a relaxed setting.  Aim to eat 5-6 small meals per day. Do not skip meals.  Drink enough fluids to keep your urine clear or pale yellow.  Ask your health care provider if you should take an over-the-counter probiotic during flare-ups to help restore healthy gut bacteria.  If you have cramping or diarrhea, try making your meals low in fat and high in carbohydrates. Examples of carbohydrates are pasta, rice, whole grain breads and cereals, fruits, and vegetables.  If dairy products cause your symptoms to flare up, try eating less of them. You might be able to handle yogurt better than other dairy products because it contains bacteria that help with digestion. What foods are not recommended? The following are some foods and drinks that may worsen your symptoms:  Fatty foods, such as [place] fries.  Milk products, such as cheese or ice cream.  Chocolate.  Alcohol.  Products with caffeine, such as coffee.  Carbonated drinks,  such as soda.  The items listed above may not be a complete list of foods and beverages to avoid. Contact your dietitian for more information. What foods are good sources of fiber? Your health care provider or dietitian may recommend that you eat more foods that contain fiber. Fiber can help reduce constipation and other IBS symptoms. Add foods with fiber to your diet a little at a time so that your body can get used to them. Too much fiber at once might cause gas and swelling of your abdomen. The following are some foods that are good sources of fiber:  Apples.  Peaches.  Pears.  Berries.  Figs.  Broccoli (raw).  Cabbage.  Carrots.  Raw peas.  Kidney beans.  Lima beans.  Whole grain bread.  Whole grain cereal.  Where to find more information: [employer] for Functional Gastrointestinal Disorders: www.iffgd.[employer] of Diabetes and Digestive and Kidney Diseases: NetworkAffair.co.za.aspx This information is not intended to replace advice given to you by your health care provider. Make sure you discuss any questions you have with your health care provider. Document Released: 05/29/2003 Document Revised: 08/14/2015 Document Reviewed: 06/08/2013 Elsevier Interactive Patient Education  2018 [place]. Irritable Bowel Syndrome, Adult Irritable bowel syndrome (IBS) is not one specific disease. It is a group of symptoms that affects the organs responsible for digestion (gastrointestinal or GI tract). To regulate how your GI tract works, your body sends signals back and forth between your intestines and your brain. If you have IBS, there may be a problem with these signals. As  a result, your GI tract does not function normally. Your intestines may become more sensitive and overreact to certain things. This is especially true when you eat certain foods or when you are under stress. There  are four types of IBS. These may be determined based on the consistency of your stool:  IBS with diarrhea.  IBS with constipation.  Mixed IBS.  Unsubtyped IBS.  It is important to know which type of IBS you have. Some treatments are more likely to be helpful for certain types of IBS. What are the causes? The exact cause of IBS is not known. What increases the risk? You may have a higher risk of IBS if:  You are a woman.  You are younger than 61 years old.  You have a family history of IBS.  You have mental health problems.  You have had bacterial infection of your GI tract.  What are the signs or symptoms? Symptoms of IBS vary from person to person. The main symptom is abdominal pain or discomfort. Additional symptoms usually include one or more of the following:  Diarrhea, constipation, or both.  Abdominal swelling or bloating.  Feeling full or sick after eating a small or regular-size meal.  Frequent gas.  Mucus in the stool.  A feeling of having more stool left after a bowel movement.  Symptoms tend to come and go. They may be associated with stress, psychiatric conditions, or nothing at all. How is this diagnosed? There is no specific test to diagnose IBS. Your health care provider will make a diagnosis based on a physical exam, medical history, and your symptoms. You may have other tests to rule out other conditions that may be causing your symptoms. These may include:  Blood tests.  X-rays.  CT scan.  Endoscopy and colonoscopy. This is a test in which your GI tract is viewed with a long, thin, flexible tube.  How is this treated? There is no cure for IBS, but treatment can help relieve symptoms. IBS treatment often includes:  Changes to your diet, such as: ? Eating more fiber. ? Avoiding foods that cause symptoms. ? Drinking more water. ? Eating regular, medium-sized portioned meals.  Medicines. These may include: ? Fiber supplements if you have  constipation. ? Medicine to control diarrhea (antidiarrheal medicines). ? Medicine to help control muscle spasms in your GI tract (antispasmodic medicines). ? Medicines to help with any mental health issues, such as antidepressants or tranquilizers.  Therapy. ? Talk therapy may help with anxiety, depression, or other mental health issues that can make IBS symptoms worse.  Stress reduction. ? Managing your stress can help keep symptoms under control.  Follow these instructions at home:  Take medicines only as directed by your health care provider.  Eat a healthy diet. ? Avoid foods and drinks with added sugar. ? Include more whole grains, fruits, and vegetables gradually into your diet. This may be especially helpful if you have IBS with constipation. ? Avoid any foods and drinks that make your symptoms worse. These may include dairy products and caffeinated or carbonated drinks. ? Do not eat large meals. ? Drink enough fluid to keep your urine clear or pale yellow.  Exercise regularly. Ask your health care provider for recommendations of good activities for you.  Keep all follow-up visits as directed by your health care provider. This is important. Contact a health care provider if:  You have constant pain.  You have trouble or pain with swallowing.  You have worsening  diarrhea. Get help right away if:  You have severe and worsening abdominal pain.  You have diarrhea and: ? You have a rash, stiff neck, or severe headache. ? You are irritable, sleepy, or difficult to awaken. ? You are weak, dizzy, or extremely thirsty.  You have bright red blood in your stool or you have black tarry stools.  You have unusual abdominal swelling that is painful.  You vomit continuously.  You vomit blood (hematemesis).  You have both abdominal pain and a fever. This information is not intended to replace advice given to you by your health care provider. Make sure you discuss any  questions you have with your health care provider. Document Released: 03/08/2005 Document Revised: 08/08/2015 Document Reviewed: 11/23/2013 Elsevier Interactive Patient Education  2018 [employer].

## 2017-04-05 NOTE — Progress Notes (Signed)
Subjective:    Patient ID: Krista Harris, female    DOB: 04/20/1955, 62 y.o.   MRN: [id number]  HPI Referred by Dr. Adalberto Ill [facility]) for IBS/diarrhea. She tells me she has foul, watery stool. She has had diarrhea since May or June. The diarrhea has been constant.  She says she does not have any formed stools. ' She is having  6 small watery stools a day.  No melena or BRRB. She was Keflex in June for a infection wrist from surgery. She was placed on Xifaxan 550mg  BID x 15 days which did not help her diarrhea in June.  GI pathogen on 03/25/2017 was negative.  She is not taking anything for her diarrhea at this time.  She says she has mucous with her stools.  She also has urgency.  Her lat colonoscopy was 4 yrs ago and she reports it was normal.  3 small polyps. She was told to come back in 5 yrs. She says she has weight loss of about 4-5 pounds over the past year.   She tried Dicyclomine but she became constipated.  No family hx of colon cancer.    Review of Systems Past Medical History:  Diagnosis Date  . BMS (burning mouth syndrome)   . Erythromelalgia ([place])   . Fibromyalgia   . Rheumatoid arthritis [facility])     Past Surgical History:  Procedure Laterality Date  . FOOT SURGERY Bilateral    for rheumatoid arthritis  . HERNIA REPAIR    . WRIST SURGERY      No Known Allergies  Current Outpatient Medications on File Prior to Visit  Medication Sig Dispense Refill  . acetaminophen (TYLENOL) 325 MG tablet Take 650 mg by mouth every 6 (six) hours as needed for mild pain.    . Cholecalciferol (VITAMIN D) 2000 units tablet Take 2,000 Units by mouth daily.  0  . clonazePAM (KLONOPIN) 0.5 MG tablet Take 0.5 mg by mouth 2 (two) times daily as needed for anxiety. Burning mouth  5  . cyclobenzaprine (FLEXERIL) 10 MG tablet Take 1 tablet (10 mg total) by mouth 3 (three) times daily. (Patient taking differently: Take 5 mg by mouth at bedtime. ) 20 tablet 0  . folic acid  (FOLVITE) 1 MG tablet Take 1 mg by mouth daily.    Marland Kitchen gabapentin (NEURONTIN) 600 MG tablet Take 600 mg by mouth 3 (three) times daily.  0  . HYDROcodone-acetaminophen (NORCO/VICODIN) 5-325 MG tablet Take by mouth 2 (two) times daily after a meal.     . methotrexate (50 MG/ML) 1 g injection Inject 100 mg into the vein once.    Marland Kitchen tiZANidine (ZANAFLEX) 4 MG capsule Take 4 mg by mouth at bedtime.     No current facility-administered medications on file prior to visit.         Objective:   Physical Exam .Blood pressure 122/78, pulse 72, temperature (!) 97.5 F (36.4 C), height 5\' 4"  (1.626 m), weight 104 lb 8 oz (47.4 kg). Alert and oriented. Skin warm and dry. Oral mucosa is moist.   . Sclera anicteric, conjunctivae is pink. Thyroid not enlarged. No cervical lymphadenopathy. Lungs clear. Heart regular rate and rhythm.  Abdomen is soft. Bowel sounds are positive. No hepatomegaly. No abdominal masses felt. No tenderness.  No edema to lower extremities.           Assessment & Plan:  Diarrhea. Did respond to Dicyclomine. Suspect she has IBS/Diarrhea.  She will start the Dicyclomine  back (once a day) and will set her up for a colonoscopy.  Colon polyp. Needs surveillance.

## 2017-04-05 NOTE — Telephone Encounter (Signed)
Sent!

## 2017-04-05 NOTE — Telephone Encounter (Signed)
Rx sent 

## 2017-04-05 NOTE — Telephone Encounter (Signed)
Patient needs Rx sent to [facility] for Dicyclomine

## 2017-04-05 NOTE — Telephone Encounter (Signed)
Patient needs trilyte 

## 2017-04-19 ENCOUNTER — Other Ambulatory Visit (HOSPITAL_COMMUNITY): Payer: Self-pay | Admitting: *Deleted

## 2017-04-19 DIAGNOSIS — Z1231 Encounter for screening mammogram for malignant neoplasm of breast: Secondary | ICD-10-CM

## 2017-04-20 ENCOUNTER — Other Ambulatory Visit (HOSPITAL_COMMUNITY): Payer: Self-pay | Admitting: Specialist

## 2017-04-20 DIAGNOSIS — Z1231 Encounter for screening mammogram for malignant neoplasm of breast: Secondary | ICD-10-CM

## 2017-04-21 ENCOUNTER — Ambulatory Visit (HOSPITAL_COMMUNITY): Payer: Medicare Other

## 2017-04-21 ENCOUNTER — Encounter (HOSPITAL_COMMUNITY): Payer: Self-pay

## 2017-04-21 ENCOUNTER — Telehealth (INDEPENDENT_AMBULATORY_CARE_PROVIDER_SITE_OTHER): Payer: Self-pay | Admitting: *Deleted

## 2017-04-21 NOTE — Telephone Encounter (Signed)
The medication you gave her is working however she now has a yeast infection.  She in wondering if OTC would work or if you can call her in something in to [employer] can call her if you have questions.

## 2017-04-21 NOTE — Telephone Encounter (Signed)
I spoke with patient. She can go to [facility] and get monostat.

## 2017-05-02 ENCOUNTER — Other Ambulatory Visit (INDEPENDENT_AMBULATORY_CARE_PROVIDER_SITE_OTHER): Payer: Self-pay | Admitting: Internal Medicine

## 2017-05-02 DIAGNOSIS — R197 Diarrhea, unspecified: Secondary | ICD-10-CM

## 2017-05-03 ENCOUNTER — Encounter (INDEPENDENT_AMBULATORY_CARE_PROVIDER_SITE_OTHER): Payer: Self-pay | Admitting: *Deleted

## 2017-05-04 ENCOUNTER — Telehealth (INDEPENDENT_AMBULATORY_CARE_PROVIDER_SITE_OTHER): Payer: Self-pay | Admitting: Internal Medicine

## 2017-05-04 ENCOUNTER — Telehealth (INDEPENDENT_AMBULATORY_CARE_PROVIDER_SITE_OTHER): Payer: Self-pay | Admitting: *Deleted

## 2017-05-04 MED ORDER — PEG 3350-KCL-NA BICARB-NACL 420 G PO SOLR: 4000.0000 mL | Freq: Once | ORAL | 0 refills | Status: AC

## 2017-05-04 NOTE — Telephone Encounter (Signed)
Rx sent to [facility]

## 2017-05-04 NOTE — Telephone Encounter (Signed)
err

## 2017-05-04 NOTE — Telephone Encounter (Signed)
Please send Trilytetly prep to [facility] [place] if not already done

## 2017-05-05 ENCOUNTER — Telehealth (INDEPENDENT_AMBULATORY_CARE_PROVIDER_SITE_OTHER): Payer: Self-pay | Admitting: *Deleted

## 2017-05-05 NOTE — Telephone Encounter (Signed)
Patient wants to make sure her insurance in effect and will call after 05/20/2017 to reschedule her colonoscopy.

## 2017-05-06 ENCOUNTER — Other Ambulatory Visit (HOSPITAL_COMMUNITY)
Admission: RE | Admit: 2017-05-06 | Discharge: 2017-05-06 | Disposition: A | Discharge status: Home / Self Care | Payer: Medicare Other | Source: Ambulatory Visit | Attending: Internal Medicine | Admitting: Internal Medicine

## 2017-05-06 DIAGNOSIS — R69 Illness, unspecified: Secondary | ICD-10-CM | POA: Insufficient documentation

## 2017-05-06 LAB — C-REACTIVE PROTEIN: CRP: <0.8 mg/dL (ref <1.0)

## 2017-05-06 LAB — CBC
HEMATOCRIT: 43.1 % (ref 36.0–46.0)
HEMOGLOBIN: 13.7 g/dL (ref 12.0–15.0)
MCH: 32.3 pg (ref 26.0–34.0)
MCHC: 31.8 g/dL (ref 30.0–36.0)
MCV: 101.7 fL — ABNORMAL HIGH (ref 78.0–100.0)
Platelets: 270 10*3/uL (ref 150–400)
RBC: 4.24 MIL/uL (ref 3.87–5.11)
RDW: 13.1 % (ref 11.5–15.5)
WBC: 5 10*3/uL (ref 4.0–10.5)

## 2017-05-06 LAB — SEDIMENTATION RATE: Sed Rate: 19 mm/hr (ref 0–22)

## 2017-05-06 LAB — CREATININE, SERUM: CREATININE: 0.53 mg/dL (ref 0.44–1.00)

## 2017-05-06 LAB — AST: AST: 22 U/L (ref 15–41)

## 2017-05-06 LAB — ALT: ALT: 21 U/L (ref 14–54)

## 2017-05-06 LAB — ALBUMIN: Albumin: 4.2 g/dL (ref 3.5–5.0)

## 2017-05-09 ENCOUNTER — Other Ambulatory Visit (HOSPITAL_COMMUNITY): Payer: Medicare Other

## 2017-05-13 ENCOUNTER — Ambulatory Visit (HOSPITAL_COMMUNITY): Admission: RE | Admit: 2017-05-13 | Payer: Medicare Other | Source: Ambulatory Visit | Admitting: Internal Medicine

## 2017-05-13 ENCOUNTER — Encounter (HOSPITAL_COMMUNITY): Admission: RE | Payer: Self-pay | Source: Ambulatory Visit

## 2017-05-13 SURGERY — COLONOSCOPY WITH PROPOFOL
Anesthesia: Monitor Anesthesia Care

## 2017-05-18 ENCOUNTER — Other Ambulatory Visit (HOSPITAL_COMMUNITY): Payer: Medicare Other

## 2017-05-20 ENCOUNTER — Encounter (HOSPITAL_COMMUNITY): Payer: Self-pay

## 2017-05-20 ENCOUNTER — Ambulatory Visit (HOSPITAL_COMMUNITY): Admit: 2017-05-20 | Payer: Medicare Other | Admitting: Internal Medicine

## 2017-05-20 SURGERY — COLONOSCOPY WITH PROPOFOL
Anesthesia: Monitor Anesthesia Care

## 2017-07-07 DIAGNOSIS — N760 Acute vaginitis: Secondary | ICD-10-CM | POA: Diagnosis not present

## 2017-07-07 DIAGNOSIS — G894 Chronic pain syndrome: Secondary | ICD-10-CM | POA: Diagnosis not present

## 2017-07-07 DIAGNOSIS — M797 Fibromyalgia: Secondary | ICD-10-CM | POA: Diagnosis not present

## 2017-07-07 DIAGNOSIS — M069 Rheumatoid arthritis, unspecified: Secondary | ICD-10-CM | POA: Diagnosis not present

## 2017-07-07 DIAGNOSIS — F900 Attention-deficit hyperactivity disorder, predominantly inattentive type: Secondary | ICD-10-CM | POA: Diagnosis not present

## 2017-07-07 DIAGNOSIS — I7381 Erythromelalgia: Secondary | ICD-10-CM | POA: Diagnosis not present

## 2017-11-04 DIAGNOSIS — K589 Irritable bowel syndrome without diarrhea: Secondary | ICD-10-CM | POA: Insufficient documentation

## 2017-11-04 DIAGNOSIS — M797 Fibromyalgia: Secondary | ICD-10-CM | POA: Insufficient documentation

## 2017-11-04 DIAGNOSIS — F909 Attention-deficit hyperactivity disorder, unspecified type: Secondary | ICD-10-CM

## 2017-11-04 HISTORY — DX: Fibromyalgia: M79.7

## 2017-11-04 HISTORY — DX: Attention-deficit hyperactivity disorder, unspecified type: F90.9

## 2017-11-04 HISTORY — DX: Irritable bowel syndrome, unspecified: K58.9

## 2017-11-10 DIAGNOSIS — K146 Glossodynia: Secondary | ICD-10-CM | POA: Insufficient documentation

## 2017-11-10 DIAGNOSIS — R682 Dry mouth, unspecified: Secondary | ICD-10-CM | POA: Insufficient documentation

## 2017-11-21 DIAGNOSIS — F17200 Nicotine dependence, unspecified, uncomplicated: Secondary | ICD-10-CM | POA: Diagnosis not present

## 2017-11-21 DIAGNOSIS — M069 Rheumatoid arthritis, unspecified: Secondary | ICD-10-CM | POA: Diagnosis not present

## 2017-11-21 DIAGNOSIS — Y9389 Activity, other specified: Secondary | ICD-10-CM | POA: Diagnosis not present

## 2017-11-21 DIAGNOSIS — Z79891 Long term (current) use of opiate analgesic: Secondary | ICD-10-CM | POA: Diagnosis not present

## 2017-11-21 DIAGNOSIS — S2220XA Unspecified fracture of sternum, initial encounter for closed fracture: Secondary | ICD-10-CM | POA: Diagnosis not present

## 2017-11-21 DIAGNOSIS — F209 Schizophrenia, unspecified: Secondary | ICD-10-CM | POA: Diagnosis not present

## 2017-11-21 DIAGNOSIS — R071 Chest pain on breathing: Secondary | ICD-10-CM | POA: Diagnosis not present

## 2017-11-21 DIAGNOSIS — R0789 Other chest pain: Secondary | ICD-10-CM | POA: Diagnosis not present

## 2017-11-21 DIAGNOSIS — R0781 Pleurodynia: Secondary | ICD-10-CM | POA: Diagnosis not present

## 2017-11-21 DIAGNOSIS — W01198A Fall on same level from slipping, tripping and stumbling with subsequent striking against other object, initial encounter: Secondary | ICD-10-CM | POA: Diagnosis not present

## 2017-11-21 DIAGNOSIS — Y92812 Truck as the place of occurrence of the external cause: Secondary | ICD-10-CM | POA: Diagnosis not present

## 2017-12-01 DIAGNOSIS — Z0000 Encounter for general adult medical examination without abnormal findings: Secondary | ICD-10-CM | POA: Diagnosis not present

## 2018-03-10 DIAGNOSIS — H524 Presbyopia: Secondary | ICD-10-CM | POA: Diagnosis not present

## 2018-03-16 DIAGNOSIS — Z0100 Encounter for examination of eyes and vision without abnormal findings: Secondary | ICD-10-CM | POA: Diagnosis not present

## 2018-05-31 ENCOUNTER — Telehealth: Payer: Self-pay | Admitting: Family Medicine

## 2018-05-31 NOTE — Telephone Encounter (Signed)
Tried to call patient because she has an upcoming appointment on 06/08/2018 and when I was checking to see if she needed a New patient packet I saw her account was created through generic mychart and a lot of those accounts have previous accounts already created, There is another account with the same name except the first name is spelled a little different but everything else is the same but I don't want to merge until I can confirm. Please verify last 4 digits of social with old profile and if correct please merge.

## 2018-06-08 ENCOUNTER — Ambulatory Visit: Payer: Self-pay | Admitting: Family Medicine

## 2018-10-09 ENCOUNTER — Other Ambulatory Visit: Payer: Self-pay | Admitting: Internal Medicine

## 2018-10-09 DIAGNOSIS — Z1231 Encounter for screening mammogram for malignant neoplasm of breast: Secondary | ICD-10-CM

## 2018-10-09 DIAGNOSIS — E2839 Other primary ovarian failure: Secondary | ICD-10-CM

## 2018-10-10 ENCOUNTER — Encounter (INDEPENDENT_AMBULATORY_CARE_PROVIDER_SITE_OTHER): Payer: Self-pay | Admitting: *Deleted

## 2018-11-28 DIAGNOSIS — M79671 Pain in right foot: Secondary | ICD-10-CM | POA: Diagnosis not present

## 2018-11-28 DIAGNOSIS — M79641 Pain in right hand: Secondary | ICD-10-CM | POA: Diagnosis not present

## 2018-12-04 DIAGNOSIS — M79671 Pain in right foot: Secondary | ICD-10-CM | POA: Diagnosis not present

## 2018-12-04 DIAGNOSIS — M79643 Pain in unspecified hand: Secondary | ICD-10-CM | POA: Diagnosis not present

## 2018-12-04 DIAGNOSIS — M79672 Pain in left foot: Secondary | ICD-10-CM | POA: Diagnosis not present

## 2018-12-04 DIAGNOSIS — M797 Fibromyalgia: Secondary | ICD-10-CM | POA: Diagnosis not present

## 2018-12-04 DIAGNOSIS — M0579 Rheumatoid arthritis with rheumatoid factor of multiple sites without organ or systems involvement: Secondary | ICD-10-CM | POA: Diagnosis not present

## 2018-12-04 DIAGNOSIS — M19042 Primary osteoarthritis, left hand: Secondary | ICD-10-CM | POA: Diagnosis not present

## 2018-12-04 DIAGNOSIS — M79673 Pain in unspecified foot: Secondary | ICD-10-CM | POA: Diagnosis not present

## 2018-12-04 DIAGNOSIS — M069 Rheumatoid arthritis, unspecified: Secondary | ICD-10-CM | POA: Diagnosis not present

## 2018-12-04 DIAGNOSIS — M1990 Unspecified osteoarthritis, unspecified site: Secondary | ICD-10-CM | POA: Diagnosis not present

## 2018-12-04 DIAGNOSIS — M79642 Pain in left hand: Secondary | ICD-10-CM | POA: Diagnosis not present

## 2018-12-04 DIAGNOSIS — Z79899 Other long term (current) drug therapy: Secondary | ICD-10-CM | POA: Diagnosis not present

## 2018-12-04 DIAGNOSIS — M79641 Pain in right hand: Secondary | ICD-10-CM | POA: Diagnosis not present

## 2018-12-04 DIAGNOSIS — M19041 Primary osteoarthritis, right hand: Secondary | ICD-10-CM | POA: Diagnosis not present

## 2018-12-04 DIAGNOSIS — M7989 Other specified soft tissue disorders: Secondary | ICD-10-CM | POA: Diagnosis not present

## 2018-12-05 DIAGNOSIS — M0579 Rheumatoid arthritis with rheumatoid factor of multiple sites without organ or systems involvement: Secondary | ICD-10-CM | POA: Diagnosis not present

## 2019-01-15 DIAGNOSIS — M7121 Synovial cyst of popliteal space [Baker], right knee: Secondary | ICD-10-CM | POA: Diagnosis not present

## 2019-01-15 DIAGNOSIS — I7381 Erythromelalgia: Secondary | ICD-10-CM | POA: Diagnosis not present

## 2019-01-15 DIAGNOSIS — M797 Fibromyalgia: Secondary | ICD-10-CM | POA: Diagnosis not present

## 2019-01-15 DIAGNOSIS — M7122 Synovial cyst of popliteal space [Baker], left knee: Secondary | ICD-10-CM | POA: Diagnosis not present

## 2019-01-15 DIAGNOSIS — K146 Glossodynia: Secondary | ICD-10-CM | POA: Diagnosis not present

## 2019-01-15 DIAGNOSIS — K589 Irritable bowel syndrome without diarrhea: Secondary | ICD-10-CM | POA: Diagnosis not present

## 2019-01-15 DIAGNOSIS — M069 Rheumatoid arthritis, unspecified: Secondary | ICD-10-CM | POA: Diagnosis not present

## 2019-01-15 DIAGNOSIS — M545 Low back pain: Secondary | ICD-10-CM | POA: Diagnosis not present

## 2019-01-16 ENCOUNTER — Other Ambulatory Visit (HOSPITAL_COMMUNITY): Payer: Self-pay | Admitting: Internal Medicine

## 2019-01-16 DIAGNOSIS — Z13820 Encounter for screening for osteoporosis: Secondary | ICD-10-CM

## 2019-01-18 ENCOUNTER — Ambulatory Visit (HOSPITAL_COMMUNITY): Payer: Medicare Other

## 2019-01-18 DIAGNOSIS — M069 Rheumatoid arthritis, unspecified: Secondary | ICD-10-CM | POA: Diagnosis not present

## 2019-01-18 DIAGNOSIS — M7122 Synovial cyst of popliteal space [Baker], left knee: Secondary | ICD-10-CM | POA: Diagnosis not present

## 2019-01-18 DIAGNOSIS — M545 Low back pain: Secondary | ICD-10-CM | POA: Diagnosis not present

## 2019-01-18 DIAGNOSIS — Z0001 Encounter for general adult medical examination with abnormal findings: Secondary | ICD-10-CM | POA: Diagnosis not present

## 2019-01-18 DIAGNOSIS — K589 Irritable bowel syndrome without diarrhea: Secondary | ICD-10-CM | POA: Diagnosis not present

## 2019-01-18 DIAGNOSIS — I7381 Erythromelalgia: Secondary | ICD-10-CM | POA: Diagnosis not present

## 2019-01-18 DIAGNOSIS — R509 Fever, unspecified: Secondary | ICD-10-CM | POA: Diagnosis not present

## 2019-01-18 DIAGNOSIS — R1030 Lower abdominal pain, unspecified: Secondary | ICD-10-CM | POA: Diagnosis not present

## 2019-01-18 DIAGNOSIS — M7121 Synovial cyst of popliteal space [Baker], right knee: Secondary | ICD-10-CM | POA: Diagnosis not present

## 2019-01-18 DIAGNOSIS — M797 Fibromyalgia: Secondary | ICD-10-CM | POA: Diagnosis not present

## 2019-01-18 DIAGNOSIS — K146 Glossodynia: Secondary | ICD-10-CM | POA: Diagnosis not present

## 2019-01-18 DIAGNOSIS — F17210 Nicotine dependence, cigarettes, uncomplicated: Secondary | ICD-10-CM | POA: Diagnosis not present

## 2019-01-29 ENCOUNTER — Ambulatory Visit (HOSPITAL_COMMUNITY): Payer: Medicare Other

## 2019-02-01 DIAGNOSIS — Z13820 Encounter for screening for osteoporosis: Secondary | ICD-10-CM | POA: Diagnosis not present

## 2019-02-01 DIAGNOSIS — M1990 Unspecified osteoarthritis, unspecified site: Secondary | ICD-10-CM | POA: Diagnosis not present

## 2019-02-01 DIAGNOSIS — M79673 Pain in unspecified foot: Secondary | ICD-10-CM | POA: Diagnosis not present

## 2019-02-01 DIAGNOSIS — M0579 Rheumatoid arthritis with rheumatoid factor of multiple sites without organ or systems involvement: Secondary | ICD-10-CM | POA: Diagnosis not present

## 2019-02-01 DIAGNOSIS — Z79899 Other long term (current) drug therapy: Secondary | ICD-10-CM | POA: Diagnosis not present

## 2019-02-01 DIAGNOSIS — M79643 Pain in unspecified hand: Secondary | ICD-10-CM | POA: Diagnosis not present

## 2019-02-01 DIAGNOSIS — M797 Fibromyalgia: Secondary | ICD-10-CM | POA: Diagnosis not present

## 2019-03-02 ENCOUNTER — Other Ambulatory Visit: Payer: Self-pay

## 2019-03-02 ENCOUNTER — Ambulatory Visit (HOSPITAL_COMMUNITY)
Admission: RE | Admit: 2019-03-02 | Discharge: 2019-03-02 | Disposition: A | Discharge status: Home / Self Care | Payer: Medicare PPO | Source: Ambulatory Visit | Attending: Internal Medicine | Admitting: Internal Medicine

## 2019-03-02 ENCOUNTER — Encounter (HOSPITAL_COMMUNITY): Payer: Self-pay

## 2019-03-02 DIAGNOSIS — M85852 Other specified disorders of bone density and structure, left thigh: Secondary | ICD-10-CM | POA: Insufficient documentation

## 2019-03-02 DIAGNOSIS — M85851 Other specified disorders of bone density and structure, right thigh: Secondary | ICD-10-CM | POA: Insufficient documentation

## 2019-03-02 DIAGNOSIS — E2839 Other primary ovarian failure: Secondary | ICD-10-CM | POA: Insufficient documentation

## 2019-03-02 DIAGNOSIS — Z13820 Encounter for screening for osteoporosis: Secondary | ICD-10-CM | POA: Diagnosis not present

## 2019-03-02 DIAGNOSIS — M8589 Other specified disorders of bone density and structure, multiple sites: Secondary | ICD-10-CM | POA: Diagnosis not present

## 2019-03-02 DIAGNOSIS — Z780 Asymptomatic menopausal state: Secondary | ICD-10-CM | POA: Insufficient documentation

## 2019-03-02 DIAGNOSIS — Z1231 Encounter for screening mammogram for malignant neoplasm of breast: Secondary | ICD-10-CM

## 2019-03-02 DIAGNOSIS — M8588 Other specified disorders of bone density and structure, other site: Secondary | ICD-10-CM | POA: Diagnosis not present

## 2019-03-06 DIAGNOSIS — M79671 Pain in right foot: Secondary | ICD-10-CM | POA: Diagnosis not present

## 2019-03-06 DIAGNOSIS — L989 Disorder of the skin and subcutaneous tissue, unspecified: Secondary | ICD-10-CM | POA: Diagnosis not present

## 2019-03-06 DIAGNOSIS — I7381 Erythromelalgia: Secondary | ICD-10-CM | POA: Diagnosis not present

## 2019-03-06 DIAGNOSIS — M069 Rheumatoid arthritis, unspecified: Secondary | ICD-10-CM | POA: Diagnosis not present

## 2019-03-13 DIAGNOSIS — Z85828 Personal history of other malignant neoplasm of skin: Secondary | ICD-10-CM | POA: Diagnosis not present

## 2019-03-13 DIAGNOSIS — L819 Disorder of pigmentation, unspecified: Secondary | ICD-10-CM | POA: Diagnosis not present

## 2019-03-29 DIAGNOSIS — M069 Rheumatoid arthritis, unspecified: Secondary | ICD-10-CM | POA: Diagnosis not present

## 2019-03-29 DIAGNOSIS — I7381 Erythromelalgia: Secondary | ICD-10-CM | POA: Diagnosis not present

## 2019-03-29 DIAGNOSIS — R8290 Unspecified abnormal findings in urine: Secondary | ICD-10-CM | POA: Diagnosis not present

## 2019-03-29 DIAGNOSIS — Z0001 Encounter for general adult medical examination with abnormal findings: Secondary | ICD-10-CM | POA: Diagnosis not present

## 2019-03-29 DIAGNOSIS — N760 Acute vaginitis: Secondary | ICD-10-CM | POA: Diagnosis not present

## 2019-03-29 DIAGNOSIS — M79671 Pain in right foot: Secondary | ICD-10-CM | POA: Diagnosis not present

## 2019-03-29 DIAGNOSIS — M79641 Pain in right hand: Secondary | ICD-10-CM | POA: Diagnosis not present

## 2019-03-29 DIAGNOSIS — K146 Glossodynia: Secondary | ICD-10-CM | POA: Diagnosis not present

## 2019-03-29 DIAGNOSIS — M810 Age-related osteoporosis without current pathological fracture: Secondary | ICD-10-CM | POA: Diagnosis not present

## 2019-03-29 DIAGNOSIS — M797 Fibromyalgia: Secondary | ICD-10-CM | POA: Diagnosis not present

## 2019-04-06 ENCOUNTER — Ambulatory Visit (INDEPENDENT_AMBULATORY_CARE_PROVIDER_SITE_OTHER): Payer: Medicare PPO | Admitting: Podiatry

## 2019-04-06 DIAGNOSIS — Z5329 Procedure and treatment not carried out because of patient's decision for other reasons: Secondary | ICD-10-CM

## 2019-04-06 NOTE — Progress Notes (Signed)
No show for appt. 

## 2019-04-07 DIAGNOSIS — R1084 Generalized abdominal pain: Secondary | ICD-10-CM | POA: Diagnosis not present

## 2019-04-07 DIAGNOSIS — M797 Fibromyalgia: Secondary | ICD-10-CM | POA: Diagnosis not present

## 2019-04-12 DIAGNOSIS — G894 Chronic pain syndrome: Secondary | ICD-10-CM | POA: Diagnosis not present

## 2019-04-12 DIAGNOSIS — R195 Other fecal abnormalities: Secondary | ICD-10-CM | POA: Diagnosis not present

## 2019-04-12 DIAGNOSIS — F419 Anxiety disorder, unspecified: Secondary | ICD-10-CM | POA: Diagnosis not present

## 2019-04-17 ENCOUNTER — Ambulatory Visit: Payer: Medicare PPO | Admitting: Podiatry

## 2019-04-17 ENCOUNTER — Ambulatory Visit (INDEPENDENT_AMBULATORY_CARE_PROVIDER_SITE_OTHER): Payer: Medicare PPO

## 2019-04-17 ENCOUNTER — Other Ambulatory Visit: Payer: Self-pay

## 2019-04-17 ENCOUNTER — Encounter: Payer: Self-pay | Admitting: Podiatry

## 2019-04-17 VITALS — BP 119/79 | HR 79 | Resp 16

## 2019-04-17 DIAGNOSIS — M778 Other enthesopathies, not elsewhere classified: Secondary | ICD-10-CM

## 2019-04-17 DIAGNOSIS — M06371 Rheumatoid nodule, right ankle and foot: Secondary | ICD-10-CM

## 2019-04-17 DIAGNOSIS — R5381 Other malaise: Secondary | ICD-10-CM | POA: Insufficient documentation

## 2019-04-17 DIAGNOSIS — R4586 Emotional lability: Secondary | ICD-10-CM | POA: Insufficient documentation

## 2019-04-17 DIAGNOSIS — N649 Disorder of breast, unspecified: Secondary | ICD-10-CM | POA: Insufficient documentation

## 2019-04-17 DIAGNOSIS — R5383 Other fatigue: Secondary | ICD-10-CM | POA: Insufficient documentation

## 2019-04-17 DIAGNOSIS — M069 Rheumatoid arthritis, unspecified: Secondary | ICD-10-CM | POA: Insufficient documentation

## 2019-04-17 NOTE — Progress Notes (Signed)
Subjective:  Patient ID: Krista Harris, female    DOB: November 05, 1955,  MRN: [id number] HPI Chief Complaint  Patient presents with  . Foot Pain    Plantar forefoot right - large, swollen area at 2nd/3rd MPJ, "throwing my balance off when I walk", puffy at dorsal forefoot too  . New Patient (Initial Visit)    64 y.o. female presents with the above complaint.   ROS: Denies fever chills nausea vomiting muscle aches pains calf pain back pain chest pain shortness of breath.  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 BMS (burning mouth syndrome)   . Erythromelalgia ([place])   . Fibromyalgia   . Rheumatoid arthritis [facility])    Past Surgical History:  Procedure Laterality Date  . AUGMENTATION MAMMAPLASTY     breast implants now removed  . FOOT SURGERY Bilateral    for rheumatoid arthritis  . HERNIA REPAIR    . WRIST SURGERY      Current Outpatient Medications:  .  naproxen (NAPROSYN) 500 MG tablet, Take 500 mg by mouth 2 (two) times daily with a meal., Disp: , Rfl:  .  clonazePAM (KLONOPIN) 1 MG tablet, , Disp: , Rfl:  .  DULoxetine (CYMBALTA) 30 MG capsule, , Disp: , Rfl:  .  gabapentin (NEURONTIN) 600 MG tablet, Take 600 mg by mouth 3 (three) times daily., Disp: , Rfl: 0 .  HYDROcodone-acetaminophen (NORCO/VICODIN) 5-325 MG tablet, Take by mouth 2 (two) times daily after a meal. , Disp: , Rfl:  .  ORENCIA CLICKJECT 0000000 MG/ML SOAJ, , Disp: , Rfl:   Allergies  Allergen Reactions  . Adalimumab Hives  . Duloxetine     Other reaction(s): Other (See Comments) Other reaction(s): Other (See Comments) Mood changes Mood changes   . Infliximab Hives  . Methotrexate Other (See Comments)    Elevated LFT Elevated LFT   . Milnacipran Other (See Comments)     mood changes  . Other     Humera; hives; Allergy; 06/12/2010 Opana; mood changes; Unspecified; 06/12/2010 Savella; mood changes;  Unspecified; 06/12/2010  . Oxymorphone Other (See Comments)     mood changes  . Pregabalin Rash   Review of Systems Objective:   Vitals:   04/17/19 1335  BP: 119/79  Pulse: 79  Resp: 16    General: Well developed, nourished, in no acute distress, alert and oriented x3   Dermatological: Skin is warm, dry and supple bilateral. Nails x 10 are well maintained; remaining integument appears unremarkable at this time. There are no open sores, no preulcerative lesions, no rash or signs of infection present.  Large soft tissue mass nonpulsatile in nature causing diastases between the second and third toes of the right foot.  Plantar scars noted.  Vascular: Dorsalis Pedis artery and Posterior Tibial artery pedal pulses are 2/4 bilateral with immedate capillary fill time. Pedal hair growth present. No varicosities and no lower extremity edema present bilateral.   Neruologic: Grossly intact via light touch bilateral. Vibratory intact via tuning fork bilateral. Protective threshold with Semmes Wienstein monofilament intact to all pedal sites bilateral. Patellar and Achilles deep tendon reflexes 2+ bilateral. No Babinski or clonus noted bilateral.   Musculoskeletal: No gross boney pedal deformities bilateral. No pain, crepitus, or limitation noted with foot and ankle range of motion bilateral. Muscular strength 5/5 in all groups tested bilateral.  No pulsatile mass between the second third toes separating the toes with  diastases right foot.  Gait: Unassisted, Nonantalgic.    Radiographs:  Radiographs taken today demonstrate osseously mature individual with mild to moderate osteopenia.  She has a diastases between the second and third toes there is no separation of the metatarsals themselves.  She is a long second metatarsal and third metatarsal.  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  Plan: Discussed etiology  pathology conservative versus surgical therapies.  At this point will be sent for an MRI with and without contrast for evaluation of the mass to the right foot.  Once this comes back we will have her in for surgical consult and excision of lesion.     Maryjean Corpening T. [place], [place]

## 2019-04-18 ENCOUNTER — Telehealth: Payer: Self-pay | Admitting: *Deleted

## 2019-04-18 ENCOUNTER — Other Ambulatory Visit: Payer: Self-pay | Admitting: Podiatry

## 2019-04-18 DIAGNOSIS — M7989 Other specified soft tissue disorders: Secondary | ICD-10-CM

## 2019-04-18 NOTE — Addendum Note (Signed)
Addended by: Harriett Sine D on: 04/18/2019 10:57 AM   Modules accepted: Orders

## 2019-04-18 NOTE — Addendum Note (Signed)
Addended by: Harriett Sine D on: 04/18/2019 04:34 PM   Modules accepted: Orders

## 2019-04-18 NOTE — Telephone Encounter (Signed)
I informed pt that to pre-cert her MRI in a timely manner, we would need firm up her choice of imaging locations. Pt states [facility] said they could get her in next week. Faxed required form to [facility] with demographics.

## 2019-04-18 NOTE — Telephone Encounter (Signed)
Pt called stating she called [facility] to get scheduled for the MRI and was not able to scheduled until 05/10/19. Pt is wanting to go to another facility sooner and would like to have the order sent to [facility], fax# [phone].

## 2019-04-18 NOTE — Telephone Encounter (Signed)
Pt states [facility] is too expensive and would like to go to [facility]. Faxed to [place] and given to A. Prevette, CMA.

## 2019-04-18 NOTE — Telephone Encounter (Signed)
Orders to A. Prervette, CMA for pre-cert, faxed to [facility].

## 2019-04-18 NOTE — Telephone Encounter (Signed)
Pt called requesting that her MRI be changed to [facility] because the cost of using [facility] is more expensive with her insurance.

## 2019-04-18 NOTE — Telephone Encounter (Signed)
-----   Message from Rip Harbour, [facility] sent at 04/17/2019  1:59 PM EST ----- Regarding: MRI MRI right foot - with and without contrast - evaluate soft tissue mass forefoot right - surgical consideration  She wants this scheduled at [facility]

## 2019-04-20 NOTE — Telephone Encounter (Signed)
[employer] Medicare Authorization  Spoke to [place]  CPT: [id number] MRI Right Foot with and without contrast  Approved - Reference [id number]  Valid from 04/23/2019 - 05/23/2019  Schedule at:   [facility] [address], [place], [place] [address] Phone: [phone]

## 2019-04-21 DIAGNOSIS — M069 Rheumatoid arthritis, unspecified: Secondary | ICD-10-CM | POA: Diagnosis not present

## 2019-04-30 ENCOUNTER — Encounter: Payer: Self-pay | Admitting: Podiatry

## 2019-04-30 DIAGNOSIS — R2241 Localized swelling, mass and lump, right lower limb: Secondary | ICD-10-CM | POA: Diagnosis not present

## 2019-04-30 DIAGNOSIS — M79671 Pain in right foot: Secondary | ICD-10-CM | POA: Diagnosis not present

## 2019-05-10 ENCOUNTER — Other Ambulatory Visit: Payer: Medicare PPO

## 2019-05-14 ENCOUNTER — Telehealth: Payer: Self-pay | Admitting: *Deleted

## 2019-05-14 DIAGNOSIS — M069 Rheumatoid arthritis, unspecified: Secondary | ICD-10-CM | POA: Diagnosis not present

## 2019-05-14 NOTE — Telephone Encounter (Signed)
Called and left a message for the [facility] at [phone] to call back and I need the report of the MRI. Krista Harris

## 2019-05-16 ENCOUNTER — Telehealth: Payer: Self-pay | Admitting: *Deleted

## 2019-05-16 NOTE — Telephone Encounter (Signed)
I informed Krista Harris Dr. Milinda Pointer had reviewed the MRI results and would like to send a copy to a radiology specialist for more details for treatment planning, there would be a 10-14 day delay in final results and once received I would call with instructions. Krista Harris states understanding.

## 2019-05-16 NOTE — Telephone Encounter (Signed)
[facility] transferred to [place] states they do not have the E-film to print in DiaCom format, but will send a copy of the MRI in the format they can copy in. I agreed.

## 2019-05-22 NOTE — Telephone Encounter (Signed)
Received [facility] MRI disc copy, it may not be printed in DiaCom. Mailed copy of MRI disc to [facility].

## 2019-06-01 ENCOUNTER — Telehealth: Payer: Self-pay | Admitting: *Deleted

## 2019-06-01 NOTE — Telephone Encounter (Signed)
[facility] states the report is ready to be signed by Dr. Milinda Pointer.

## 2019-06-01 NOTE — Telephone Encounter (Signed)
Pt called states she was told her MRI was being sent for a 2nd reading and she didn't understand why, and was told it would be back in 7-10 days,and her foot is really painful.

## 2019-06-01 NOTE — Telephone Encounter (Signed)
Left message informing pt the results of the overread had been received in the 10-14 day timeframe as explained with the 1st call to explain necessity for the overread and she should contact our office to schedule with Dr. Milinda Pointer to go over the results.

## 2019-07-25 DIAGNOSIS — R0602 Shortness of breath: Secondary | ICD-10-CM | POA: Diagnosis not present

## 2019-07-25 DIAGNOSIS — M25561 Pain in right knee: Secondary | ICD-10-CM | POA: Diagnosis not present

## 2019-08-13 DIAGNOSIS — M545 Low back pain: Secondary | ICD-10-CM | POA: Diagnosis not present

## 2019-08-13 DIAGNOSIS — M810 Age-related osteoporosis without current pathological fracture: Secondary | ICD-10-CM | POA: Diagnosis not present

## 2019-08-13 DIAGNOSIS — M79643 Pain in unspecified hand: Secondary | ICD-10-CM | POA: Diagnosis not present

## 2019-08-13 DIAGNOSIS — R5383 Other fatigue: Secondary | ICD-10-CM | POA: Diagnosis not present

## 2019-08-13 DIAGNOSIS — M797 Fibromyalgia: Secondary | ICD-10-CM | POA: Diagnosis not present

## 2019-08-13 DIAGNOSIS — M0579 Rheumatoid arthritis with rheumatoid factor of multiple sites without organ or systems involvement: Secondary | ICD-10-CM | POA: Diagnosis not present

## 2019-08-13 DIAGNOSIS — M79673 Pain in unspecified foot: Secondary | ICD-10-CM | POA: Diagnosis not present

## 2019-08-13 DIAGNOSIS — Z79899 Other long term (current) drug therapy: Secondary | ICD-10-CM | POA: Diagnosis not present

## 2019-08-14 ENCOUNTER — Ambulatory Visit: Payer: Medicare Other | Admitting: Orthopaedic Surgery

## 2019-08-27 DIAGNOSIS — R062 Wheezing: Secondary | ICD-10-CM | POA: Diagnosis not present

## 2019-08-27 DIAGNOSIS — R5383 Other fatigue: Secondary | ICD-10-CM | POA: Diagnosis not present

## 2019-08-28 NOTE — Progress Notes (Signed)
CARDIOLOGY CONSULT NOTE       Patient ID: Krista Harris MRN: [id number] DOB/AGE: [birth year] 63 y.o.  Admit date: (Not on file) Referring Physician: Nevada Crane Primary Physician: Celene Squibb, MD Primary Cardiologist: New Reason for Consultation: CAD  Active Problems:   * No active hospital problems. *   HPI:  64 y.o. referred by Dr Nevada Crane for CAD. History of fibromyalgia and RA. His office note from May indicated patients activity limited by left knee pain She sees a rheumatologist She was started on Rinvoq  She wanted a stress test due to family history of CHF She complains of exertional dyspnea but this seems functional. No edema, PND, orthopnea or active coughing / wheezing fever  She has no documented history of CAD, CHF, Arrhythmia, Syncope She is a current everyday smoker   She has periods of dyspnea/fatigue sometimes tightness in chest She smokes a ppd for years She is from [place] and has no other family here   ROS All other systems reviewed and negative except as noted above  Past Medical History:  Diagnosis Date  . BMS (burning mouth syndrome)   . 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 Fibromyalgia   . Rheumatoid arthritis ([place])     Family History  Problem Relation Age of Onset  . High blood pressure Mother   . High Cholesterol Mother   . Congestive Heart Failure Father     Social History   Socioeconomic History  . Marital status: Single    Spouse name: Not on file  . Number of children: Not on file  . Years of education: Not on file  . Highest education level: Not on file  Occupational History  . Not on file  Tobacco Use  . Smoking status: Current Every Day Smoker    Packs/day: 0.50    Types: Cigarettes  . Smokeless tobacco: Never Used  Substance and Sexual Activity  . Alcohol use: No  . Drug use: No  . Sexual activity: Not on file  Other Topics Concern  . Not on file  Social History Narrative  . Not on file   Social Determinants of Health   Financial  Resource Strain:   . Difficulty of Paying Living Expenses:   Food Insecurity:   . Worried About Charity fundraiser in the Last Year:   . Arboriculturist in the Last Year:   Transportation Needs:   . Film/video editor (Medical):   Marland Kitchen Lack of Transportation (Non-Medical):   Physical Activity:   . Days of Exercise per Week:   . Minutes of Exercise per Session:   Stress:   . Feeling of Stress :   Social Connections:   . Frequency of Communication with Friends and Family:   . Frequency of Social Gatherings with Friends and Family:   . Attends Religious Services:   . Active Member of Clubs or Organizations:   . Attends Archivist Meetings:   Marland Kitchen Marital Status:   Intimate Partner Violence:   . Fear of Current or Ex-Partner:   . Emotionally Abused:   Marland Kitchen Physically Abused:   . Sexually Abused:     Past Surgical History:  Procedure Laterality Date  . AUGMENTATION MAMMAPLASTY     breast implants now removed  . FOOT SURGERY Bilateral    for rheumatoid arthritis  . HERNIA REPAIR    . WRIST SURGERY        Current Outpatient Medications:  .  clonazePAM (KLONOPIN) 1 MG tablet, ,  Disp: , Rfl:  .  DULoxetine (CYMBALTA) 30 MG capsule, , Disp: , Rfl:  .  gabapentin (NEURONTIN) 600 MG tablet, Take 600 mg by mouth 3 (three) times daily., Disp: , Rfl: 0 .  HYDROcodone-acetaminophen (NORCO/VICODIN) 5-325 MG tablet, Take by mouth 2 (two) times daily after a meal. , Disp: , Rfl:  .  ORENCIA CLICKJECT 458 MG/ML SOAJ, , Disp: , Rfl:     Physical Exam: Blood pressure 110/60, pulse 72, height 5' 4.5" (1.638 m), weight 107 lb 6.4 oz (48.7 kg), SpO2 98 %.    Affect appropriate Healthy:  appears stated age 34: normal Neck supple with no adenopathy JVP normal no bruits no thyromegaly Lungs clear with no wheezing and good diaphragmatic motion Heart:  S1/S2 no murmur, no rub, gallop or click PMI normal Abdomen: benighn, BS positve, no tenderness, no AAA no bruit.  No HSM or  HJR Distal pulses intact with no bruits No edema Neuro non-focal Skin warm and dry No muscular weakness  Labs:   Lab Results  Component Value Date   WBC 5.0 05/06/2017   HGB 13.7 05/06/2017   HCT 43.1 05/06/2017   MCV 101.7 (H) 05/06/2017   PLT 270 05/06/2017     Radiology: No results found.  EKG: SR rate 68 normal ECG 2018  09/05/19 SR rate 72 normal    ASSESSMENT AND PLAN:   1. CAD:  Risk-  normal ECG unable to walk on treadmill due to RA/fibromyalgia f/u ETT as ECG is normal  2. Family History DCM:  No signs on exam, history ECG will order echo to assess EF 3. RA/Fibromyalgia:  F/u with rheumatology in [place] now has had steroids and narcotic pain meds as well  4. Smoking:  Counseled on cessation < 10 minutes lung cancer screening CT ordered   Signed: Jenkins Rouge 09/05/2019, 3:20 PM

## 2019-09-05 ENCOUNTER — Ambulatory Visit (INDEPENDENT_AMBULATORY_CARE_PROVIDER_SITE_OTHER): Payer: Medicare Other | Admitting: Cardiovascular Disease

## 2019-09-05 ENCOUNTER — Other Ambulatory Visit: Payer: Self-pay

## 2019-09-05 ENCOUNTER — Encounter: Payer: Self-pay | Admitting: Cardiovascular Disease

## 2019-09-05 VITALS — BP 110/60 | HR 72 | Ht 64.5 in | Wt 107.4 lb

## 2019-09-05 DIAGNOSIS — Z8249 Family history of ischemic heart disease and other diseases of the circulatory system: Secondary | ICD-10-CM

## 2019-09-05 DIAGNOSIS — R0609 Other forms of dyspnea: Secondary | ICD-10-CM

## 2019-09-05 DIAGNOSIS — R0600 Dyspnea, unspecified: Secondary | ICD-10-CM | POA: Diagnosis not present

## 2019-09-05 DIAGNOSIS — F17200 Nicotine dependence, unspecified, uncomplicated: Secondary | ICD-10-CM

## 2019-09-05 DIAGNOSIS — R079 Chest pain, unspecified: Secondary | ICD-10-CM | POA: Diagnosis not present

## 2019-09-05 NOTE — Patient Instructions (Signed)
Medication Instructions:  Your physician recommends that you continue on your current medications as directed. Please refer to the Current Medication list given to you today. *If you need a refill on your cardiac medications before your next appointment, please call your pharmacy*   Lab Work: NONE  If you have labs (blood work) drawn today and your tests are completely normal, you will receive your results only by: Marland Kitchen MyChart Message (if you have MyChart) OR . A paper copy in the mail If you have any lab test that is abnormal or we need to change your treatment, we will call you to review the results.   Testing/Procedures: Your physician has requested that you have an echocardiogram. Echocardiography is a painless test that uses sound waves to create images of your heart. It provides your doctor with information about the size and shape of your heart and how well your heart's chambers and valves are working. This procedure takes approximately one hour. There are no restrictions for this procedure.  Your physician has requested that you have an exercise tolerance test. For further information please visit [email or web address]. Please also follow instruction sheet, as given.  Non-Cardiac CT scanning, (CAT scanning), is a noninvasive, special x-ray that produces cross-sectional images of the body using x-rays and a computer. CT scans help physicians diagnose and treat medical conditions. For some CT exams, a contrast material is used to enhance visibility in the area of the body being studied. CT scans provide greater clarity and reveal more details than regular x-ray exams.   Follow-Up: At [facility], you and your health needs are our priority.  As part of our continuing mission to provide you with exceptional heart care, we have created designated Provider Care Teams.  These Care Teams include your primary Cardiologist (physician) and Advanced Practice Providers (APPs -  Physician Assistants  and Nurse Practitioners) who all work together to provide you with the care you need, when you need it.  We recommend signing up for the patient portal called "MyChart".  Sign up information is provided on this After Visit Summary.  MyChart is used to connect with patients for Virtual Visits (Telemedicine).  Patients are able to view lab/test results, encounter notes, upcoming appointments, etc.  Non-urgent messages can be sent to your provider as well.   To learn more about what you can do with MyChart, go to [email or web address].    Your next appointment:    As Needed   The format for your next appointment:   In Person  Provider:   Jenkins Rouge, MD   Other Instructions Thank you for choosing [facility]!

## 2019-09-20 DIAGNOSIS — I7381 Erythromelalgia: Secondary | ICD-10-CM | POA: Diagnosis not present

## 2019-09-20 DIAGNOSIS — F17210 Nicotine dependence, cigarettes, uncomplicated: Secondary | ICD-10-CM | POA: Diagnosis not present

## 2019-09-20 DIAGNOSIS — K589 Irritable bowel syndrome without diarrhea: Secondary | ICD-10-CM | POA: Diagnosis not present

## 2019-09-20 DIAGNOSIS — K146 Glossodynia: Secondary | ICD-10-CM | POA: Diagnosis not present

## 2019-09-20 DIAGNOSIS — M2550 Pain in unspecified joint: Secondary | ICD-10-CM | POA: Diagnosis not present

## 2019-09-20 DIAGNOSIS — R5382 Chronic fatigue, unspecified: Secondary | ICD-10-CM | POA: Diagnosis not present

## 2019-09-20 DIAGNOSIS — M069 Rheumatoid arthritis, unspecified: Secondary | ICD-10-CM | POA: Diagnosis not present

## 2019-09-20 DIAGNOSIS — W57XXXA Bitten or stung by nonvenomous insect and other nonvenomous arthropods, initial encounter: Secondary | ICD-10-CM | POA: Diagnosis not present

## 2019-10-05 ENCOUNTER — Other Ambulatory Visit (HOSPITAL_COMMUNITY): Payer: Medicare Other

## 2019-10-08 ENCOUNTER — Ambulatory Visit (HOSPITAL_COMMUNITY): Payer: Medicare Other

## 2019-10-08 ENCOUNTER — Encounter (HOSPITAL_COMMUNITY): Payer: Medicare Other

## 2019-10-08 ENCOUNTER — Other Ambulatory Visit (HOSPITAL_COMMUNITY): Payer: Medicare Other

## 2019-10-17 ENCOUNTER — Ambulatory Visit (HOSPITAL_COMMUNITY): Admission: RE | Admit: 2019-10-17 | Payer: Medicare Other | Source: Ambulatory Visit

## 2019-10-17 ENCOUNTER — Other Ambulatory Visit: Payer: Self-pay

## 2019-10-17 ENCOUNTER — Ambulatory Visit (HOSPITAL_BASED_OUTPATIENT_CLINIC_OR_DEPARTMENT_OTHER)
Admission: RE | Admit: 2019-10-17 | Discharge: 2019-10-17 | Disposition: A | Discharge status: Home / Self Care | Payer: Medicare Other | Source: Ambulatory Visit | Attending: Internal Medicine | Admitting: Internal Medicine

## 2019-10-17 ENCOUNTER — Ambulatory Visit (HOSPITAL_COMMUNITY)
Admission: RE | Admit: 2019-10-17 | Discharge: 2019-10-17 | Disposition: A | Discharge status: Home / Self Care | Payer: Medicare Other | Source: Ambulatory Visit | Attending: Internal Medicine | Admitting: Internal Medicine

## 2019-10-17 DIAGNOSIS — R079 Chest pain, unspecified: Secondary | ICD-10-CM

## 2019-10-17 DIAGNOSIS — R0600 Dyspnea, unspecified: Secondary | ICD-10-CM | POA: Diagnosis not present

## 2019-10-17 LAB — ECHOCARDIOGRAM COMPLETE
Area-P 1/2: 3.28 cm2
S' Lateral: 2.39 cm

## 2019-10-17 NOTE — Progress Notes (Signed)
*  PRELIMINARY RESULTS* Echocardiogram 2D Echocardiogram has been performed.  Krista Harris 10/17/2019, 1:49 PM

## 2019-10-18 LAB — EXERCISE TOLERANCE TEST
Estimated workload: 8.7 METS
Exercise duration (min): 6 min
Exercise duration (sec): 35 s
MPHR: 158 {beats}/min
Peak HR: 148 {beats}/min
Percent HR: 93 %
RPE: 13
Rest HR: 82 {beats}/min

## 2019-10-22 DIAGNOSIS — M79641 Pain in right hand: Secondary | ICD-10-CM | POA: Diagnosis not present

## 2019-10-22 DIAGNOSIS — N760 Acute vaginitis: Secondary | ICD-10-CM | POA: Diagnosis not present

## 2019-10-22 DIAGNOSIS — Z0001 Encounter for general adult medical examination with abnormal findings: Secondary | ICD-10-CM | POA: Diagnosis not present

## 2019-10-22 DIAGNOSIS — M810 Age-related osteoporosis without current pathological fracture: Secondary | ICD-10-CM | POA: Diagnosis not present

## 2019-10-22 DIAGNOSIS — E782 Mixed hyperlipidemia: Secondary | ICD-10-CM | POA: Diagnosis not present

## 2019-10-22 DIAGNOSIS — M79671 Pain in right foot: Secondary | ICD-10-CM | POA: Diagnosis not present

## 2019-10-25 DIAGNOSIS — I7381 Erythromelalgia: Secondary | ICD-10-CM | POA: Diagnosis not present

## 2019-10-25 DIAGNOSIS — Z0001 Encounter for general adult medical examination with abnormal findings: Secondary | ICD-10-CM | POA: Diagnosis not present

## 2019-11-08 ENCOUNTER — Ambulatory Visit (HOSPITAL_COMMUNITY): Payer: Medicare Other

## 2019-11-14 DIAGNOSIS — M797 Fibromyalgia: Secondary | ICD-10-CM | POA: Diagnosis not present

## 2019-11-14 DIAGNOSIS — M79643 Pain in unspecified hand: Secondary | ICD-10-CM | POA: Diagnosis not present

## 2019-11-14 DIAGNOSIS — Z79899 Other long term (current) drug therapy: Secondary | ICD-10-CM | POA: Diagnosis not present

## 2019-11-14 DIAGNOSIS — M0579 Rheumatoid arthritis with rheumatoid factor of multiple sites without organ or systems involvement: Secondary | ICD-10-CM | POA: Diagnosis not present

## 2019-11-14 DIAGNOSIS — M79673 Pain in unspecified foot: Secondary | ICD-10-CM | POA: Diagnosis not present

## 2020-01-30 ENCOUNTER — Other Ambulatory Visit: Payer: Self-pay

## 2020-01-30 ENCOUNTER — Other Ambulatory Visit (HOSPITAL_COMMUNITY): Payer: Self-pay | Admitting: Internal Medicine

## 2020-01-30 ENCOUNTER — Ambulatory Visit (HOSPITAL_COMMUNITY)
Admission: RE | Admit: 2020-01-30 | Discharge: 2020-01-30 | Disposition: A | Discharge status: Home / Self Care | Payer: Medicare Other | Source: Ambulatory Visit | Attending: Internal Medicine | Admitting: Internal Medicine

## 2020-01-30 DIAGNOSIS — M79671 Pain in right foot: Secondary | ICD-10-CM

## 2020-01-30 DIAGNOSIS — F17210 Nicotine dependence, cigarettes, uncomplicated: Secondary | ICD-10-CM | POA: Diagnosis not present

## 2020-01-30 DIAGNOSIS — M810 Age-related osteoporosis without current pathological fracture: Secondary | ICD-10-CM | POA: Diagnosis not present

## 2020-01-30 DIAGNOSIS — I7381 Erythromelalgia: Secondary | ICD-10-CM | POA: Diagnosis not present

## 2020-02-06 DIAGNOSIS — M069 Rheumatoid arthritis, unspecified: Secondary | ICD-10-CM | POA: Diagnosis not present

## 2020-02-06 DIAGNOSIS — G5761 Lesion of plantar nerve, right lower limb: Secondary | ICD-10-CM | POA: Diagnosis not present

## 2020-02-07 DIAGNOSIS — R079 Chest pain, unspecified: Secondary | ICD-10-CM | POA: Diagnosis not present

## 2020-02-07 DIAGNOSIS — M797 Fibromyalgia: Secondary | ICD-10-CM | POA: Diagnosis not present

## 2020-02-07 DIAGNOSIS — M79673 Pain in unspecified foot: Secondary | ICD-10-CM | POA: Diagnosis not present

## 2020-02-07 DIAGNOSIS — M0579 Rheumatoid arthritis with rheumatoid factor of multiple sites without organ or systems involvement: Secondary | ICD-10-CM | POA: Diagnosis not present

## 2020-02-07 DIAGNOSIS — G8929 Other chronic pain: Secondary | ICD-10-CM | POA: Diagnosis not present

## 2020-02-07 DIAGNOSIS — R0781 Pleurodynia: Secondary | ICD-10-CM | POA: Diagnosis not present

## 2020-02-07 DIAGNOSIS — Z79899 Other long term (current) drug therapy: Secondary | ICD-10-CM | POA: Diagnosis not present

## 2020-02-07 DIAGNOSIS — M79643 Pain in unspecified hand: Secondary | ICD-10-CM | POA: Diagnosis not present

## 2020-02-08 DIAGNOSIS — G5761 Lesion of plantar nerve, right lower limb: Secondary | ICD-10-CM | POA: Diagnosis not present

## 2020-02-08 DIAGNOSIS — M069 Rheumatoid arthritis, unspecified: Secondary | ICD-10-CM | POA: Diagnosis not present

## 2020-02-20 DIAGNOSIS — M069 Rheumatoid arthritis, unspecified: Secondary | ICD-10-CM | POA: Diagnosis not present

## 2020-02-20 DIAGNOSIS — G5761 Lesion of plantar nerve, right lower limb: Secondary | ICD-10-CM | POA: Diagnosis not present

## 2020-02-21 ENCOUNTER — Encounter: Payer: Self-pay | Admitting: Podiatry

## 2020-02-21 ENCOUNTER — Ambulatory Visit: Payer: Medicare Other | Admitting: Podiatry

## 2020-02-21 ENCOUNTER — Other Ambulatory Visit: Payer: Self-pay

## 2020-02-21 DIAGNOSIS — S92501A Displaced unspecified fracture of right lesser toe(s), initial encounter for closed fracture: Secondary | ICD-10-CM | POA: Diagnosis not present

## 2020-02-23 NOTE — Progress Notes (Signed)
She presents today after having not seen her since January.  She presents today and states that she has pain at the dorsal forefoot around the fourth and fifth toes of the right foot.  She states that she was given the dog a bath and was injured when the leash got wrapped around her foot and pulled.  She states that shoes are not comfortable she had an x-ray done at [facility] and that it demonstrated no fractures but just a neuroma which was then injected by the doctor.  Objective: Vital signs are stable alert and oriented x3.  Pulses are palpable.  Neurologic sensorium is intact.  There is erythema and edema along the fourth fifth metatarsophalangeal joint area of the right foot.  Exquisitely tender on palpation to the fifth toe.  Radiographs were reviewed today demonstrating a complete transverse fracture of the proximal phalanx fifth digit mildly dislocated.  Assessment: Fracture fifth digit right.  Plan: We placed her in a Darco shoe today expressed to her that she needs to stay off of this is much as possible and I will follow-up with her in the near future.  Another set of x-rays to be done at that time

## 2020-03-26 DIAGNOSIS — G5761 Lesion of plantar nerve, right lower limb: Secondary | ICD-10-CM | POA: Diagnosis not present

## 2020-03-26 DIAGNOSIS — M25552 Pain in left hip: Secondary | ICD-10-CM | POA: Diagnosis not present

## 2020-03-26 DIAGNOSIS — M069 Rheumatoid arthritis, unspecified: Secondary | ICD-10-CM | POA: Diagnosis not present

## 2020-03-26 DIAGNOSIS — S92201D Fracture of unspecified tarsal bone(s) of right foot, subsequent encounter for fracture with routine healing: Secondary | ICD-10-CM | POA: Diagnosis not present

## 2020-04-01 ENCOUNTER — Other Ambulatory Visit: Payer: Self-pay

## 2020-04-01 ENCOUNTER — Ambulatory Visit (INDEPENDENT_AMBULATORY_CARE_PROVIDER_SITE_OTHER): Payer: Medicare Other | Admitting: Orthopaedic Surgery

## 2020-04-01 ENCOUNTER — Encounter: Payer: Self-pay | Admitting: Orthopaedic Surgery

## 2020-04-01 VITALS — BP 140/86 | HR 82 | Ht 64.0 in | Wt 102.0 lb

## 2020-04-01 DIAGNOSIS — F17210 Nicotine dependence, cigarettes, uncomplicated: Secondary | ICD-10-CM | POA: Diagnosis not present

## 2020-04-01 DIAGNOSIS — M79652 Pain in left thigh: Secondary | ICD-10-CM | POA: Diagnosis not present

## 2020-04-01 MED ORDER — PREDNISONE 5 MG (21) PO TBPK: ORAL_TABLET | ORAL | 0 refills | Status: DC

## 2020-04-01 NOTE — Patient Instructions (Signed)
Steps to Quit Smoking Smoking tobacco is the leading cause of preventable death. It can affect almost every organ in the body. Smoking puts you and people around you at risk for many serious, long-lasting (chronic) diseases. Quitting smoking can be hard, but it is one of the best things that you can do for your health. It is never too late to quit. How do I get ready to quit? When you decide to quit smoking, make a plan to help you succeed. Before you quit:  Pick a date to quit. Set a date within the next 2 weeks to give you time to prepare.  Write down the reasons why you are quitting. Keep this list in places where you will see it often.  Tell your family, friends, and co-workers that you are quitting. Their support is important.  Talk with your doctor about the choices that may help you quit.  Find out if your health insurance will pay for these treatments.  Know the people, places, things, and activities that make you want to smoke (triggers). Avoid them. What first steps can I take to quit smoking?  Throw away all cigarettes at home, at work, and in your car.  Throw away the things that you use when you smoke, such as ashtrays and lighters.  Clean your car. Make sure to empty the ashtray.  Clean your home, including curtains and carpets. What can I do to help me quit smoking? Talk with your doctor about taking medicines and seeing a counselor at the same time. You are more likely to succeed when you do both.  If you are pregnant or breastfeeding, talk with your doctor about counseling or other ways to quit smoking. Do not take medicine to help you quit smoking unless your doctor tells you to do so. To quit smoking: Quit right away  Quit smoking totally, instead of slowly cutting back on how much you smoke over a period of time.  Go to counseling. You are more likely to quit if you go to counseling sessions regularly. Take medicine You may take medicines to help you quit. Some  medicines need a prescription, and some you can buy over-the-counter. Some medicines may contain a drug called nicotine to replace the nicotine in cigarettes. Medicines may:  Help you to stop having the desire to smoke (cravings).  Help to stop the problems that come when you stop smoking (withdrawal symptoms). Your doctor may ask you to use:  Nicotine patches, gum, or lozenges.  Nicotine inhalers or sprays.  Non-nicotine medicine that is taken by mouth. Find resources Find resources and other ways to help you quit smoking and remain smoke-free after you quit. These resources are most helpful when you use them often. They include:  Online chats with a counselor.  Phone quitlines.  Printed self-help materials.  Support groups or group counseling.  Text messaging programs.  Mobile phone apps. Use apps on your mobile phone or tablet that can help you stick to your quit plan. There are many free apps for mobile phones and tablets as well as websites. Examples include Quit Guide from the CDC and smokefree.gov   What things can I do to make it easier to quit?  Talk to your family and friends. Ask them to support and encourage you.  Call a phone quitline (1-800-QUIT-NOW), reach out to support groups, or work with a counselor.  Ask people who smoke to not smoke around you.  Avoid places that make you want to smoke,   such as: ? Bars. ? Parties. ? Smoke-break areas at work.  Spend time with people who do not smoke.  Lower the stress in your life. Stress can make you want to smoke. Try these things to help your stress: ? Getting regular exercise. ? Doing deep-breathing exercises. ? Doing yoga. ? Meditating. ? Doing a body scan. To do this, close your eyes, focus on one area of your body at a time from head to toe. Notice which parts of your body are tense. Try to relax the muscles in those areas.   How will I feel when I quit smoking? Day 1 to 3 weeks Within the first 24 hours,  you may start to have some problems that come from quitting tobacco. These problems are very bad 2-3 days after you quit, but they do not often last for more than 2-3 weeks. You may get these symptoms:  Mood swings.  Feeling restless, nervous, angry, or annoyed.  Trouble concentrating.  Dizziness.  Strong desire for high-sugar foods and nicotine.  Weight gain.  Trouble pooping (constipation).  Feeling like you may vomit (nausea).  Coughing or a sore throat.  Changes in how the medicines that you take for other issues work in your body.  Depression.  Trouble sleeping (insomnia). Week 3 and afterward After the first 2-3 weeks of quitting, you may start to notice more positive results, such as:  Better sense of smell and taste.  Less coughing and sore throat.  Slower heart rate.  Lower blood pressure.  Clearer skin.  Better breathing.  Fewer sick days. Quitting smoking can be hard. Do not give up if you fail the first time. Some people need to try a few times before they succeed. Do your best to stick to your quit plan, and talk with your doctor if you have any questions or concerns. Summary  Smoking tobacco is the leading cause of preventable death. Quitting smoking can be hard, but it is one of the best things that you can do for your health.  When you decide to quit smoking, make a plan to help you succeed.  Quit smoking right away, not slowly over a period of time.  When you start quitting, seek help from your doctor, family, or friends. This information is not intended to replace advice given to you by your health care provider. Make sure you discuss any questions you have with your health care provider. Document Revised: 12/01/2018 Document Reviewed: 05/27/2018 Elsevier Patient Education  2021 Elsevier Inc.  

## 2020-04-01 NOTE — Progress Notes (Signed)
Subjective:    Patient ID: Krista Harris, female    DOB: [birth year], 65 y.o.   MRN: [id number]  HPI She repaired under her sink on New Years Day and began having pain in the left thigh, anteriorly.  She has had continued deep pain.  She saw Dr. Wende Neighbors and was given Flexeril and pain medicine.  She continues to have pain.  She says the pain comes and goes but is intense when she has it.  She has history of left shoulder pain and lower back pain.  She has no weakness.   Review of Systems  Constitutional: Positive for activity change.  Musculoskeletal: Positive for arthralgias, back pain and myalgias.  All other systems reviewed and are negative.  For Review of Systems, all other systems reviewed and are negative.  The following is a summary of the past history medically, past history surgically, known current medicines, social history and family history.  This information is gathered electronically by the computer from prior information and documentation.  I review this each visit and have found including this information at this point in the chart is beneficial and informative.   Past Medical History:  Diagnosis Date  . BMS (burning mouth syndrome)   . 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 Fibromyalgia   . Rheumatoid arthritis [facility])     Past Surgical History:  Procedure Laterality Date  . AUGMENTATION MAMMAPLASTY     breast implants now removed  . FOOT SURGERY Bilateral    for rheumatoid arthritis  . HERNIA REPAIR    . WRIST SURGERY      Current Outpatient Medications on File Prior to Visit  Medication Sig Dispense Refill  . clonazePAM (KLONOPIN) 1 MG tablet     . DULoxetine (CYMBALTA) 30 MG capsule     . gabapentin (NEURONTIN) 600 MG tablet Take 600 mg by mouth 3 (three) times daily.  0  . HYDROcodone-acetaminophen (NORCO/VICODIN) 5-325 MG tablet Take by mouth 2 (two) times daily after a meal.    . ibandronate (BONIVA) 150 MG tablet Take 150 mg by mouth every 30 (thirty)  days.    Marland Kitchen ibuprofen (ADVIL) 800 MG tablet Take 800 mg by mouth every 8 (eight) hours as needed.    . triamcinolone (KENALOG) 0.025 % cream SMARTSIG:Sparingly Topical Twice Daily PRN    . ORENCIA CLICKJECT 951 MG/ML SOAJ  (Patient not taking: Reported on 04/01/2020)    . XELJANZ XR 11 MG TB24  (Patient not taking: Reported on 04/01/2020)     No current facility-administered medications on file prior to visit.    Social History   Socioeconomic History  . Marital status: Single    Spouse name: Not on file  . Number of children: Not on file  . Years of education: Not on file  . Highest education level: Not on file  Occupational History  . Not on file  Tobacco Use  . Smoking status: Current Every Day Smoker    Packs/day: 0.50    Types: Cigarettes  . Smokeless tobacco: Never Used  Substance and Sexual Activity  . Alcohol use: No  . Drug use: No  . Sexual activity: Not on file  Other Topics Concern  . Not on file  Social History Narrative  . Not on file   Social Determinants of Health   Financial Resource Strain: Not on file  Food Insecurity: Not on file  Transportation Needs: Not on file  Physical Activity: Not on file  Stress: Not on file  Social Connections: Not on file  Intimate Partner Violence: Not on file    Family History  Problem Relation Age of Onset  . High blood pressure Mother   . High Cholesterol Mother   . Congestive Heart Failure Father     BP 140/86   Pulse 82   Ht 5\' 4"  (1.626 m)   Wt 102 lb (46.3 kg)   BMI 17.51 kg/m   Body mass index is 17.51 kg/m.      Objective:   Physical Exam Vitals and nursing note reviewed. Exam conducted with a chaperone present.  Constitutional:      Appearance: She is well-developed and well-nourished.  HENT:     Head: Normocephalic and atraumatic.  Eyes:     Extraocular Movements: EOM normal.     Conjunctiva/sclera: Conjunctivae normal.     Pupils: Pupils are equal, round, and reactive to light.   Cardiovascular:     Rate and Rhythm: Normal rate and regular rhythm.     Pulses: Intact distal pulses.  Pulmonary:     Effort: Pulmonary effort is normal.  Abdominal:     Palpations: Abdomen is soft.  Musculoskeletal:     Cervical back: Normal range of motion and neck supple.       Legs:  Skin:    General: Skin is warm and dry.  Neurological:     Mental Status: She is alert and oriented to person, place, and time.     Cranial Nerves: No cranial nerve deficit.     Motor: No abnormal muscle tone.     Coordination: Coordination normal.     Deep Tendon Reflexes: Reflexes are normal and symmetric. Reflexes normal.  Psychiatric:        Mood and Affect: Mood and affect normal.        Behavior: Behavior normal.        Thought Content: Thought content normal.        Judgment: Judgment normal.           Assessment & Plan:   Encounter Diagnoses  Name Primary?  . Acute pain of left thigh Yes  . Nicotine dependence, cigarettes, uncomplicated    I will give prednisone dose pack.  Continue the Flexeril.  Return in one week.  She may need x-rays of lumbar spine, MRI.  Call if any problem.  Precautions discussed.   Electronically Signed Sanjuana Kava, MD 1/11/202210:13 AM

## 2020-04-08 ENCOUNTER — Ambulatory Visit: Payer: Medicare Other | Admitting: Orthopaedic Surgery

## 2020-04-09 ENCOUNTER — Other Ambulatory Visit: Payer: Self-pay

## 2020-04-09 ENCOUNTER — Ambulatory Visit: Payer: Self-pay

## 2020-04-09 ENCOUNTER — Ambulatory Visit (INDEPENDENT_AMBULATORY_CARE_PROVIDER_SITE_OTHER): Payer: Medicare Other | Admitting: Orthopaedic Surgery

## 2020-04-09 ENCOUNTER — Encounter: Payer: Self-pay | Admitting: Orthopaedic Surgery

## 2020-04-09 VITALS — BP 139/85 | HR 82 | Ht 64.0 in | Wt 106.5 lb

## 2020-04-09 DIAGNOSIS — M5442 Lumbago with sciatica, left side: Secondary | ICD-10-CM

## 2020-04-09 DIAGNOSIS — G8929 Other chronic pain: Secondary | ICD-10-CM

## 2020-04-09 MED ORDER — PREDNISONE 5 MG (21) PO TBPK: ORAL_TABLET | ORAL | 0 refills | Status: DC

## 2020-04-09 NOTE — Patient Instructions (Signed)

## 2020-04-09 NOTE — Progress Notes (Signed)
Patient Krista Harris, female DOB:June 22, 1955, 65 y.o. VZD:[id number]  Chief Complaint  Patient presents with  . Leg Pain    L/ thigh since the Prednisone is finished the pain coming back.    HPI  Krista Harris is a 65 y.o. female who has increasing pain of the left thigh anteriorly.  She has history of rheumatoid arthritis and has been on various treatments for this.  She has moved to this area from "up [place]".  I do not have her old records to review.  I saw her last week and gave prednisone dose pack.  It helped a lot but this morning she had recurrence of pain.  She had an appointment for tomorrow in [place] but called and asked to be seen by me in [place] today as she was very concerned of the increased pain.  She has no weakness, no numbness.  She has no new trauma.  The pain is in the anterior left thigh.  She has no swelling, no redness.    Body mass index is 18.28 kg/m.  ROS  Review of Systems  Constitutional: Positive for activity change.  Musculoskeletal: Positive for arthralgias, back pain and myalgias.  All other systems reviewed and are negative.   All other systems reviewed and are negative.  The following is a summary of the past history medically, past history surgically, known current medicines, social history and family history.  This information is gathered electronically by the computer from prior information and documentation.  I review this each visit and have found including this information at this point in the chart is beneficial and informative.    Past Medical History:  Diagnosis Date  . BMS (burning mouth syndrome)   . Erythromelalgia ([place])   . Fibromyalgia   . Rheumatoid arthritis [facility])     Past Surgical History:  Procedure Laterality Date  . AUGMENTATION MAMMAPLASTY     breast implants now removed  . FOOT SURGERY Bilateral    for rheumatoid arthritis  . HERNIA REPAIR    . WRIST SURGERY      Family History  Problem  Relation Age of Onset  . High blood pressure Mother   . High Cholesterol Mother   . Congestive Heart Failure Father     Social History Social History   Tobacco Use  . Smoking status: Current Every Day Smoker    Packs/day: 0.50    Types: Cigarettes  . Smokeless tobacco: Never Used  Substance Use Topics  . Alcohol use: No  . Drug use: No    Allergies  Allergen Reactions  . Adalimumab Hives  . Duloxetine     Other reaction(s): Other (See Comments) Other reaction(s): Other (See Comments) Mood changes Mood changes   . Infliximab Hives  . Methotrexate Other (See Comments)    Elevated LFT Elevated LFT   . Milnacipran Other (See Comments)     mood changes  . Other     Humera; hives; Allergy; 06/12/2010 Opana; mood changes; Unspecified; 06/12/2010 Savella; mood changes; Unspecified; 06/12/2010  . Oxymorphone Other (See Comments)     mood changes  . Pregabalin Rash    Current Outpatient Medications  Medication Sig Dispense Refill  . clonazePAM (KLONOPIN) 1 MG tablet     . DULoxetine (CYMBALTA) 30 MG capsule     . gabapentin (NEURONTIN) 600 MG tablet Take 600 mg by mouth 3 (three) times daily.  0  . HYDROcodone-acetaminophen (NORCO/VICODIN) 5-325 MG tablet Take by mouth 2 (two) times daily after  a meal.    . ibandronate (BONIVA) 150 MG tablet Take 150 mg by mouth every 30 (thirty) days.    Marland Kitchen ibuprofen (ADVIL) 800 MG tablet Take 800 mg by mouth every 8 (eight) hours as needed.    Marland Kitchen ORENCIA CLICKJECT 229 MG/ML SOAJ  (Patient not taking: Reported on 04/01/2020)    . predniSONE (STERAPRED UNI-PAK 21 TAB) 5 MG (21) TBPK tablet Take 6 pills first day; 5 pills second day; 4 pills third day; 3 pills fourth day; 2 pills next day and 1 pill last day. 21 tablet 0  . triamcinolone (KENALOG) 0.025 % cream SMARTSIG:Sparingly Topical Twice Daily PRN    . XELJANZ XR 11 MG TB24  (Patient not taking: Reported on 04/01/2020)     No current facility-administered medications for this visit.      Physical Exam  Blood pressure 139/85, pulse 82, height 5\' 4"  (1.626 m), weight 106 lb 8 oz (48.3 kg).  Constitutional: overall normal hygiene, normal nutrition, well developed, normal grooming, normal body habitus. Assistive device:none  Musculoskeletal: gait and station Limp none, muscle tone and strength are normal, no tremors or atrophy is present.  .  Neurological: coordination overall normal.  Deep tendon reflex/nerve stretch intact.  Sensation normal.  Cranial nerves II-XII intact.   Skin:   Normal overall no scars, lesions, ulcers or rashes. No psoriasis.  Psychiatric: Alert and oriented x 3.  Recent memory intact, remote memory unclear.  Normal mood and affect. Well groomed.  Good eye contact.  Cardiovascular: overall no swelling, no varicosities, no edema bilaterally, normal temperatures of the legs and arms, no clubbing, cyanosis and good capillary refill.  Lymphatic: palpation is normal.  Spine/Pelvis examination:  Inspection:  Overall, sacoiliac joint benign and hips nontender; without crepitus or defects.   Thoracic spine inspection: Alignment normal without kyphosis present   Lumbar spine inspection:  Alignment  with normal lumbar lordosis, without scoliosis apparent.   Thoracic spine palpation:  without tenderness of spinal processes   Lumbar spine palpation: without tenderness of lumbar area; without tightness of lumbar muscles    Range of Motion:   Lumbar flexion, forward flexion is normal without pain or tenderness    Lumbar extension is full without pain or tenderness   Left lateral bend is normal without pain or tenderness   Right lateral bend is normal without pain or tenderness   Straight leg raising is normal  Strength & tone: normal   Stability overall normal stability  The left thigh has tenderness anteriorly but no redness or swelling.  Gait is normal.  All other systems reviewed and are negative   The patient has been educated about the  nature of the problem(s) and counseled on treatment options.  The patient appeared to understand what I have discussed and is in agreement with it.  Encounter Diagnosis  Name Primary?  . Chronic midline low back pain with left-sided sciatica Yes   X-rays were done of the lumbar spine, reported separately.  I will get MRI of the lumbar spine.  I will refill her prednisone dose pack.   PLAN Call if any problems.  Precautions discussed.  Continue current medications.   Return to clinic 2 weeks   Electronically Signed Sanjuana Kava, MD 1/19/202211:20 AM

## 2020-04-10 ENCOUNTER — Ambulatory Visit: Payer: Medicare Other | Admitting: Podiatry

## 2020-04-10 ENCOUNTER — Ambulatory Visit: Payer: Medicare Other | Admitting: Orthopaedic Surgery

## 2020-04-23 ENCOUNTER — Ambulatory Visit: Payer: Medicare Other | Admitting: Orthopaedic Surgery

## 2020-04-23 ENCOUNTER — Other Ambulatory Visit: Payer: Self-pay

## 2020-04-23 ENCOUNTER — Ambulatory Visit (HOSPITAL_COMMUNITY)
Admission: RE | Admit: 2020-04-23 | Discharge: 2020-04-23 | Disposition: A | Discharge status: Home / Self Care | Payer: Medicare Other | Source: Ambulatory Visit | Attending: Orthopaedic Surgery | Admitting: Orthopaedic Surgery

## 2020-04-23 DIAGNOSIS — G8929 Other chronic pain: Secondary | ICD-10-CM

## 2020-04-23 DIAGNOSIS — M545 Low back pain, unspecified: Secondary | ICD-10-CM | POA: Diagnosis not present

## 2020-04-23 DIAGNOSIS — M5442 Lumbago with sciatica, left side: Secondary | ICD-10-CM | POA: Insufficient documentation

## 2020-04-24 ENCOUNTER — Ambulatory Visit: Payer: Medicare Other | Admitting: Podiatry

## 2020-04-29 ENCOUNTER — Ambulatory Visit (INDEPENDENT_AMBULATORY_CARE_PROVIDER_SITE_OTHER): Payer: Medicare Other | Admitting: Orthopaedic Surgery

## 2020-04-29 ENCOUNTER — Ambulatory Visit: Payer: Medicare Other | Admitting: Orthopaedic Surgery

## 2020-04-29 ENCOUNTER — Encounter: Payer: Self-pay | Admitting: Orthopaedic Surgery

## 2020-04-29 ENCOUNTER — Other Ambulatory Visit: Payer: Self-pay

## 2020-04-29 VITALS — Wt 106.0 lb

## 2020-04-29 DIAGNOSIS — M05742 Rheumatoid arthritis with rheumatoid factor of left hand without organ or systems involvement: Secondary | ICD-10-CM

## 2020-04-29 DIAGNOSIS — M05741 Rheumatoid arthritis with rheumatoid factor of right hand without organ or systems involvement: Secondary | ICD-10-CM | POA: Diagnosis not present

## 2020-04-29 DIAGNOSIS — G8929 Other chronic pain: Secondary | ICD-10-CM | POA: Diagnosis not present

## 2020-04-29 DIAGNOSIS — M5442 Lumbago with sciatica, left side: Secondary | ICD-10-CM

## 2020-04-29 NOTE — Progress Notes (Signed)
Patient Krista Harris, female DOB:[birth year], 65 y.o. ASN:[id number]  Chief Complaint  Patient presents with  . Back Pain    HPI  Krista Harris is a 65 y.o. female who has lower back pain with left sided sciatica and is rheumatoid positive.  She had MRI which showed: IMPRESSION: 1. 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 Moderate left L3-4 neural foraminal stenosis. 2. No other spinal canal or neural foraminal stenosis.  I have independently reviewed the MRI.     I have explained the findings to her.  I will have her see neurosurgeon. Her pain is worse.  She has problems with her rheumatoid affecting her hands and feet.  She has seen rheumatologist up [facility] before moving here.  I will try to get her into see rheumatologist here.  Body mass index is 18.19 kg/m.  ROS  Review of Systems  Constitutional: Positive for activity change.  Musculoskeletal: Positive for arthralgias, back pain and myalgias.  All other systems reviewed and are negative.   All other systems reviewed and are negative.  The following is a summary of the past history medically, past history surgically, known current medicines, social history and family history.  This information is gathered electronically by the computer from prior information and documentation.  I review this each visit and have found including this information at this point in the chart is beneficial and informative.    Past Medical History:  Diagnosis Date  . BMS (burning mouth syndrome)   . Erythromelalgia ([place])   . Fibromyalgia   . Rheumatoid arthritis [facility])     Past Surgical History:  Procedure Laterality Date  . AUGMENTATION MAMMAPLASTY     breast implants now removed  . FOOT SURGERY Bilateral    for rheumatoid arthritis  . HERNIA REPAIR    . WRIST SURGERY      Family History  Problem Relation Age of Onset  .  High blood pressure Mother   . High Cholesterol Mother   . Congestive Heart Failure Father     Social History Social History   Tobacco Use  . Smoking status: Current Every Day Smoker    Packs/day: 0.50    Types: Cigarettes  . Smokeless tobacco: Never Used  Substance Use Topics  . Alcohol use: No  . Drug use: No    Allergies  Allergen Reactions  . Adalimumab Hives  . Duloxetine     Other reaction(s): Other (See Comments) Other reaction(s): Other (See Comments) Mood changes Mood changes   . Infliximab Hives  . Methotrexate Other (See Comments)    Elevated LFT Elevated LFT   . Milnacipran Other (See Comments)     mood changes  . Other     Humera; hives; Allergy; 06/12/2010 Opana; mood changes; Unspecified; 06/12/2010 Savella; mood changes; Unspecified; 06/12/2010  . Oxymorphone Other (See Comments)     mood changes  . Pregabalin Rash    Current Outpatient Medications  Medication Sig Dispense Refill  . clonazePAM (KLONOPIN) 1 MG tablet     . DULoxetine (CYMBALTA) 30 MG capsule     . gabapentin (NEURONTIN) 600 MG tablet Take 600 mg by mouth 3 (three) times daily.  0  . HYDROcodone-acetaminophen (NORCO/VICODIN) 5-325 MG tablet Take by mouth 2 (two) times daily after a meal.    . ibandronate (BONIVA) 150 MG tablet Take 150 mg by mouth every 30 (thirty) days.    Marland Kitchen triamcinolone (  KENALOG) 0.025 % cream SMARTSIG:Sparingly Topical Twice Daily PRN    . ibuprofen (ADVIL) 800 MG tablet Take 800 mg by mouth every 8 (eight) hours as needed.     No current facility-administered medications for this visit.     Physical Exam  Weight 106 lb (48.1 kg).  Constitutional: overall normal hygiene, normal nutrition, well developed, normal grooming, normal body habitus. Assistive device:none  Musculoskeletal: gait and station Limp none, muscle tone and strength are normal, no tremors or atrophy is present.  .  Neurological: coordination overall normal.  Deep tendon reflex/nerve  stretch intact.  Sensation normal.  Cranial nerves II-XII intact.   Skin:   Normal overall no scars, lesions, ulcers or rashes. No psoriasis.  Psychiatric: Alert and oriented x 3.  Recent memory intact, remote memory unclear.  Normal mood and affect. Well groomed.  Good eye contact.  Cardiovascular: overall no swelling, no varicosities, no edema bilaterally, normal temperatures of the legs and arms, no clubbing, cyanosis and good capillary refill.  Lymphatic: palpation is normal.  Spine/Pelvis examination:  Inspection:  Overall, sacoiliac joint benign and hips nontender; without crepitus or defects.   Thoracic spine inspection: Alignment normal without kyphosis present   Lumbar spine inspection:  Alignment  with normal lumbar lordosis, without scoliosis apparent.   Thoracic spine palpation:  without tenderness of spinal processes   Lumbar spine palpation: with tenderness of lumbar area; without tightness of lumbar muscles    Range of Motion:   Lumbar flexion, forward flexion is normal with pain or tenderness    Lumbar extension is full with pain or tenderness   Left lateral bend is normal with pain or tenderness   Right lateral bend is normal with pain or tenderness   Straight leg raising is abnormal.  Positive at 25 on the left.   Strength & tone: normal   Stability overall normal stability  All other systems reviewed and are negative   The patient has been educated about the nature of the problem(s) and counseled on treatment options.  The patient appeared to understand what I have discussed and is in agreement with it.  Encounter Diagnoses  Name Primary?  . Chronic midline low back pain with left-sided sciatica Yes  . Rheumatoid arthritis involving both hands with positive rheumatoid factor ([place])     PLAN Call if any problems.  Precautions discussed.  Continue current medications.   Return to clinic to neurosurgery, to rheumatology   Electronically Signed Sanjuana Kava, MD 2/8/20229:12 AM

## 2020-04-29 NOTE — Progress Notes (Signed)
amb  

## 2020-05-01 ENCOUNTER — Ambulatory Visit: Payer: Medicare Other | Admitting: Orthopaedic Surgery

## 2020-05-02 DIAGNOSIS — M5416 Radiculopathy, lumbar region: Secondary | ICD-10-CM | POA: Diagnosis not present

## 2020-05-05 ENCOUNTER — Telehealth: Payer: Self-pay | Admitting: Orthopaedic Surgery

## 2020-05-05 NOTE — Telephone Encounter (Signed)
Patient called wanting a stronger pain medicine called in  She states Dr. Luna Glasgow knows what is going on  Pharmacy:  [facility] on [address]

## 2020-05-06 MED ORDER — HYDROCODONE-ACETAMINOPHEN 7.5-325 MG PO TABS: 1.0000 | ORAL_TABLET | Freq: Four times a day (QID) | ORAL | 0 refills | Status: DC | PRN

## 2020-05-08 DIAGNOSIS — H40012 Open angle with borderline findings, low risk, left eye: Secondary | ICD-10-CM | POA: Diagnosis not present

## 2020-05-08 DIAGNOSIS — H5203 Hypermetropia, bilateral: Secondary | ICD-10-CM | POA: Diagnosis not present

## 2020-05-16 DIAGNOSIS — M79671 Pain in right foot: Secondary | ICD-10-CM | POA: Diagnosis not present

## 2020-05-16 DIAGNOSIS — Z0001 Encounter for general adult medical examination with abnormal findings: Secondary | ICD-10-CM | POA: Diagnosis not present

## 2020-05-16 DIAGNOSIS — M79641 Pain in right hand: Secondary | ICD-10-CM | POA: Diagnosis not present

## 2020-05-16 DIAGNOSIS — S92201D Fracture of unspecified tarsal bone(s) of right foot, subsequent encounter for fracture with routine healing: Secondary | ICD-10-CM | POA: Diagnosis not present

## 2020-05-16 DIAGNOSIS — Z79899 Other long term (current) drug therapy: Secondary | ICD-10-CM | POA: Diagnosis not present

## 2020-05-19 DIAGNOSIS — Z79899 Other long term (current) drug therapy: Secondary | ICD-10-CM | POA: Diagnosis not present

## 2020-05-19 DIAGNOSIS — M797 Fibromyalgia: Secondary | ICD-10-CM | POA: Diagnosis not present

## 2020-05-19 DIAGNOSIS — M810 Age-related osteoporosis without current pathological fracture: Secondary | ICD-10-CM | POA: Diagnosis not present

## 2020-05-19 DIAGNOSIS — E785 Hyperlipidemia, unspecified: Secondary | ICD-10-CM | POA: Diagnosis not present

## 2020-05-19 DIAGNOSIS — K146 Glossodynia: Secondary | ICD-10-CM | POA: Diagnosis not present

## 2020-05-19 DIAGNOSIS — I7381 Erythromelalgia: Secondary | ICD-10-CM | POA: Diagnosis not present

## 2020-05-19 DIAGNOSIS — D531 Other megaloblastic anemias, not elsewhere classified: Secondary | ICD-10-CM | POA: Diagnosis not present

## 2020-05-19 DIAGNOSIS — M069 Rheumatoid arthritis, unspecified: Secondary | ICD-10-CM | POA: Diagnosis not present

## 2020-05-19 DIAGNOSIS — F17210 Nicotine dependence, cigarettes, uncomplicated: Secondary | ICD-10-CM | POA: Diagnosis not present

## 2020-06-04 ENCOUNTER — Telehealth: Payer: Self-pay | Admitting: Orthopaedic Surgery

## 2020-06-04 NOTE — Telephone Encounter (Signed)
Patient requests Hydrocodone/Actaminophen 7.5-325 mgs.  Qty  90  Sig: Take 1 tablet by mouth every 6 (six) hours as needed for moderate pain (Must last 30 days.).  Patient uses [facility] on [address] 

## 2020-06-05 MED ORDER — HYDROCODONE-ACETAMINOPHEN 7.5-325 MG PO TABS: 1.0000 | ORAL_TABLET | Freq: Four times a day (QID) | ORAL | 0 refills | Status: DC | PRN

## 2020-07-07 ENCOUNTER — Telehealth: Payer: Self-pay | Admitting: Orthopaedic Surgery

## 2020-07-07 MED ORDER — HYDROCODONE-ACETAMINOPHEN 7.5-325 MG PO TABS: 1.0000 | ORAL_TABLET | Freq: Four times a day (QID) | ORAL | 0 refills | Status: DC | PRN

## 2020-07-07 NOTE — Telephone Encounter (Signed)
Patient requests Hydrocodone/Actaminophen 7.5-325 mgs.  Qty  90  Sig: Take 1 tablet by mouth every 6 (six) hours as needed for moderate pain (Must last 30 days.).  Patient uses [facility] on [address]

## 2020-07-18 DIAGNOSIS — M5416 Radiculopathy, lumbar region: Secondary | ICD-10-CM | POA: Diagnosis not present

## 2020-08-26 ENCOUNTER — Ambulatory Visit: Payer: Medicare Other | Admitting: Orthopaedic Surgery

## 2020-08-26 ENCOUNTER — Encounter: Payer: Self-pay | Admitting: Orthopaedic Surgery

## 2020-08-26 ENCOUNTER — Ambulatory Visit: Payer: Medicare Other

## 2020-08-26 ENCOUNTER — Other Ambulatory Visit: Payer: Self-pay

## 2020-08-26 VITALS — BP 118/70 | HR 66 | Ht 64.0 in | Wt 98.8 lb

## 2020-08-26 DIAGNOSIS — G8929 Other chronic pain: Secondary | ICD-10-CM

## 2020-08-26 DIAGNOSIS — M05741 Rheumatoid arthritis with rheumatoid factor of right hand without organ or systems involvement: Secondary | ICD-10-CM

## 2020-08-26 DIAGNOSIS — M05742 Rheumatoid arthritis with rheumatoid factor of left hand without organ or systems involvement: Secondary | ICD-10-CM

## 2020-08-26 NOTE — Progress Notes (Signed)
My left hand hurts.  She has rheumatoid arthritis.  She has had surgery of the base of the right thumb in the past when she lived up [place].  Now she is developing pain all the time of the left nondominant hand and thumb base.  She has difficulty in moving the left thumb and using it.  She tried to see Dr. Estanislado Pandy for evaluation but she declined to see her.  I will try to get her to see another rheumatologist.  The patient has her records to transfer.  She is taking prednisone prescribed by another physician.  She was to have surgery on her back May 31st but cancelled as her mother became ill.  X-rays were done of the left hand, reported separately.  Encounter Diagnoses  Name Primary?  . Chronic pain of left thumb Yes  . Rheumatoid arthritis involving both hands with positive rheumatoid factor ([place])    I will have her see a hand surgeon for possible surgery on the left thumb base.  I will have the staff try to find her a rheumatologist.  Return here in two months.  Call if any problem.  Precautions discussed.   Electronically Signed Sanjuana Kava, MD 6/7/20229:59 AM

## 2020-09-01 ENCOUNTER — Telehealth: Payer: Self-pay | Admitting: Orthopaedic Surgery

## 2020-09-01 NOTE — Telephone Encounter (Signed)
Spoke with patient she was wondering the status of the referral to [facility]. I told her to give them alittle time and if she hasn't heard anything by the end of the week to give [place] a call and we will check the status of her referrals.

## 2020-09-01 NOTE — Telephone Encounter (Signed)
Patient has some questions please call her back.

## 2020-09-12 DIAGNOSIS — K580 Irritable bowel syndrome with diarrhea: Secondary | ICD-10-CM | POA: Insufficient documentation

## 2020-09-15 DIAGNOSIS — E782 Mixed hyperlipidemia: Secondary | ICD-10-CM | POA: Diagnosis not present

## 2020-09-15 DIAGNOSIS — Z0000 Encounter for general adult medical examination without abnormal findings: Secondary | ICD-10-CM | POA: Diagnosis not present

## 2020-09-15 DIAGNOSIS — M818 Other osteoporosis without current pathological fracture: Secondary | ICD-10-CM | POA: Diagnosis not present

## 2020-09-15 DIAGNOSIS — Z131 Encounter for screening for diabetes mellitus: Secondary | ICD-10-CM | POA: Diagnosis not present

## 2020-09-18 DIAGNOSIS — M069 Rheumatoid arthritis, unspecified: Secondary | ICD-10-CM | POA: Diagnosis not present

## 2020-09-18 DIAGNOSIS — M810 Age-related osteoporosis without current pathological fracture: Secondary | ICD-10-CM | POA: Insufficient documentation

## 2020-09-18 DIAGNOSIS — D531 Other megaloblastic anemias, not elsewhere classified: Secondary | ICD-10-CM | POA: Insufficient documentation

## 2020-09-18 DIAGNOSIS — E782 Mixed hyperlipidemia: Secondary | ICD-10-CM | POA: Diagnosis not present

## 2020-09-18 DIAGNOSIS — M797 Fibromyalgia: Secondary | ICD-10-CM | POA: Diagnosis not present

## 2020-09-18 DIAGNOSIS — K580 Irritable bowel syndrome with diarrhea: Secondary | ICD-10-CM | POA: Diagnosis not present

## 2020-09-24 ENCOUNTER — Other Ambulatory Visit: Payer: Self-pay | Admitting: Orthopedic Surgery

## 2020-09-24 DIAGNOSIS — M1812 Unilateral primary osteoarthritis of first carpometacarpal joint, left hand: Secondary | ICD-10-CM | POA: Diagnosis not present

## 2020-09-24 DIAGNOSIS — T63441A Toxic effect of venom of bees, accidental (unintentional), initial encounter: Secondary | ICD-10-CM | POA: Diagnosis not present

## 2020-11-03 DIAGNOSIS — G894 Chronic pain syndrome: Secondary | ICD-10-CM | POA: Diagnosis not present

## 2020-12-07 ENCOUNTER — Emergency Department (HOSPITAL_COMMUNITY): Payer: Medicare Other

## 2020-12-07 ENCOUNTER — Other Ambulatory Visit: Payer: Self-pay

## 2020-12-07 ENCOUNTER — Emergency Department (HOSPITAL_COMMUNITY)
Admission: EM | Admit: 2020-12-07 | Discharge: 2020-12-07 | Disposition: A | Discharge status: Home / Self Care | Payer: Medicare Other | Attending: Emergency Medicine | Admitting: Emergency Medicine

## 2020-12-07 ENCOUNTER — Encounter (HOSPITAL_COMMUNITY): Payer: Self-pay | Admitting: *Deleted

## 2020-12-07 DIAGNOSIS — F17210 Nicotine dependence, cigarettes, uncomplicated: Secondary | ICD-10-CM | POA: Insufficient documentation

## 2020-12-07 DIAGNOSIS — Z0389 Encounter for observation for other suspected diseases and conditions ruled out: Secondary | ICD-10-CM | POA: Diagnosis not present

## 2020-12-07 DIAGNOSIS — S0993XA Unspecified injury of face, initial encounter: Secondary | ICD-10-CM | POA: Diagnosis not present

## 2020-12-07 DIAGNOSIS — M533 Sacrococcygeal disorders, not elsewhere classified: Secondary | ICD-10-CM | POA: Diagnosis not present

## 2020-12-07 DIAGNOSIS — Z23 Encounter for immunization: Secondary | ICD-10-CM | POA: Insufficient documentation

## 2020-12-07 DIAGNOSIS — M7918 Myalgia, other site: Secondary | ICD-10-CM

## 2020-12-07 DIAGNOSIS — S0990XA Unspecified injury of head, initial encounter: Secondary | ICD-10-CM | POA: Diagnosis not present

## 2020-12-07 DIAGNOSIS — M7601 Gluteal tendinitis, right hip: Secondary | ICD-10-CM | POA: Diagnosis not present

## 2020-12-07 MED ORDER — TETANUS-DIPHTH-ACELL PERTUSSIS 5-2.5-18.5 LF-MCG/0.5 IM SUSY
0.5000 mL | PREFILLED_SYRINGE | Freq: Once | INTRAMUSCULAR | Status: AC
  Administered 2020-12-07: 0.5 mL via INTRAMUSCULAR
  Filled 2020-12-07: qty 0.5

## 2020-12-07 MED ORDER — CYCLOBENZAPRINE HCL 10 MG PO TABS: 10.0000 mg | ORAL_TABLET | Freq: Two times a day (BID) | ORAL | 0 refills | Status: DC | PRN
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
## 2020-12-07 NOTE — Discharge Instructions (Signed)
Suspect you have muscular pain, recommend over-the-counter pain medications, also given you Flexeril please use as needed for pain.  Please follow-up with your PCP for further evaluation.  Come back to the emergency department if you develop chest pain, shortness of breath, severe abdominal pain, uncontrolled nausea, vomiting, diarrhea.
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
## 2020-12-12 ENCOUNTER — Ambulatory Visit (HOSPITAL_COMMUNITY)
Admission: RE | Admit: 2020-12-12 | Discharge: 2020-12-12 | Disposition: A | Discharge status: Home / Self Care | Payer: Medicare Other | Source: Ambulatory Visit | Attending: Family Medicine | Admitting: Family Medicine

## 2020-12-12 ENCOUNTER — Other Ambulatory Visit (HOSPITAL_COMMUNITY): Payer: Self-pay | Admitting: Family Medicine

## 2020-12-12 ENCOUNTER — Other Ambulatory Visit: Payer: Self-pay

## 2020-12-12 DIAGNOSIS — S300XXD Contusion of lower back and pelvis, subsequent encounter: Secondary | ICD-10-CM

## 2020-12-12 DIAGNOSIS — M545 Low back pain, unspecified: Secondary | ICD-10-CM | POA: Diagnosis not present

## 2020-12-12 DIAGNOSIS — M25552 Pain in left hip: Secondary | ICD-10-CM | POA: Diagnosis not present

## 2020-12-17 DIAGNOSIS — Z1159 Encounter for screening for other viral diseases: Secondary | ICD-10-CM | POA: Diagnosis not present

## 2020-12-17 DIAGNOSIS — M0579 Rheumatoid arthritis with rheumatoid factor of multiple sites without organ or systems involvement: Secondary | ICD-10-CM | POA: Diagnosis not present

## 2020-12-17 DIAGNOSIS — Z79899 Other long term (current) drug therapy: Secondary | ICD-10-CM | POA: Diagnosis not present

## 2020-12-17 DIAGNOSIS — M059 Rheumatoid arthritis with rheumatoid factor, unspecified: Secondary | ICD-10-CM | POA: Diagnosis not present

## 2020-12-17 DIAGNOSIS — Z7952 Long term (current) use of systemic steroids: Secondary | ICD-10-CM | POA: Diagnosis not present

## 2020-12-23 DIAGNOSIS — E782 Mixed hyperlipidemia: Secondary | ICD-10-CM | POA: Diagnosis not present

## 2020-12-23 DIAGNOSIS — Z0000 Encounter for general adult medical examination without abnormal findings: Secondary | ICD-10-CM | POA: Diagnosis not present

## 2020-12-28 DIAGNOSIS — D7589 Other specified diseases of blood and blood-forming organs: Secondary | ICD-10-CM | POA: Insufficient documentation

## 2020-12-29 DIAGNOSIS — G894 Chronic pain syndrome: Secondary | ICD-10-CM | POA: Diagnosis not present

## 2020-12-29 DIAGNOSIS — E785 Hyperlipidemia, unspecified: Secondary | ICD-10-CM | POA: Diagnosis not present

## 2020-12-29 DIAGNOSIS — D7589 Other specified diseases of blood and blood-forming organs: Secondary | ICD-10-CM | POA: Diagnosis not present

## 2020-12-29 DIAGNOSIS — M797 Fibromyalgia: Secondary | ICD-10-CM | POA: Diagnosis not present

## 2020-12-30 ENCOUNTER — Other Ambulatory Visit: Payer: Self-pay | Admitting: Orthopedic Surgery

## 2021-01-01 ENCOUNTER — Other Ambulatory Visit: Payer: Self-pay

## 2021-01-01 ENCOUNTER — Encounter (HOSPITAL_BASED_OUTPATIENT_CLINIC_OR_DEPARTMENT_OTHER): Payer: Self-pay | Admitting: Orthopedic Surgery

## 2021-01-06 DIAGNOSIS — D531 Other megaloblastic anemias, not elsewhere classified: Secondary | ICD-10-CM | POA: Diagnosis not present

## 2021-01-06 DIAGNOSIS — D7589 Other specified diseases of blood and blood-forming organs: Secondary | ICD-10-CM | POA: Diagnosis not present

## 2021-01-06 DIAGNOSIS — K439 Ventral hernia without obstruction or gangrene: Secondary | ICD-10-CM | POA: Diagnosis not present

## 2021-01-06 DIAGNOSIS — M545 Low back pain, unspecified: Secondary | ICD-10-CM | POA: Diagnosis not present

## 2021-01-06 DIAGNOSIS — R5383 Other fatigue: Secondary | ICD-10-CM | POA: Diagnosis not present

## 2021-01-08 ENCOUNTER — Ambulatory Visit (HOSPITAL_BASED_OUTPATIENT_CLINIC_OR_DEPARTMENT_OTHER): Admission: RE | Admit: 2021-01-08 | Payer: Medicare Other | Source: Home / Self Care | Admitting: Orthopedic Surgery

## 2021-01-08 SURGERY — CARPOMETACARPEL (CMC) SUSPENSION PLASTY
Anesthesia: Choice | Site: Thumb | Laterality: Left

## 2021-01-19 DIAGNOSIS — M130 Polyarthritis, unspecified: Secondary | ICD-10-CM | POA: Diagnosis not present

## 2021-01-19 DIAGNOSIS — I7381 Erythromelalgia: Secondary | ICD-10-CM | POA: Diagnosis not present

## 2021-01-19 DIAGNOSIS — M069 Rheumatoid arthritis, unspecified: Secondary | ICD-10-CM | POA: Diagnosis not present

## 2021-01-19 DIAGNOSIS — M810 Age-related osteoporosis without current pathological fracture: Secondary | ICD-10-CM | POA: Diagnosis not present

## 2021-01-19 DIAGNOSIS — M79671 Pain in right foot: Secondary | ICD-10-CM | POA: Diagnosis not present

## 2021-01-19 DIAGNOSIS — Z79891 Long term (current) use of opiate analgesic: Secondary | ICD-10-CM | POA: Diagnosis not present

## 2021-01-19 DIAGNOSIS — G894 Chronic pain syndrome: Secondary | ICD-10-CM | POA: Diagnosis not present

## 2021-01-27 ENCOUNTER — Encounter: Payer: Self-pay | Admitting: Podiatry

## 2021-01-27 ENCOUNTER — Ambulatory Visit: Payer: Medicare Other | Admitting: Podiatry

## 2021-01-27 ENCOUNTER — Other Ambulatory Visit: Payer: Self-pay

## 2021-01-27 DIAGNOSIS — M778 Other enthesopathies, not elsewhere classified: Secondary | ICD-10-CM

## 2021-01-27 DIAGNOSIS — M7989 Other specified soft tissue disorders: Secondary | ICD-10-CM

## 2021-01-27 NOTE — Progress Notes (Signed)
She presents today for follow-up of her capsulitis second and third metatarsal phalangeal joints of her right foot.  Last time she was and we had requested an MRI which she failed to get.  She states that something has to be done nothing seems to be happening.  This continues to go on.  Objective: Vital signs are stable she is alert and oriented x3 pulses are palpable.  She has pain on palpation of the enlarged second and third metatarsophalangeal joints where previous surgery had been performed to remove pannus due to her rheumatoid arthritis.  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  Plan: At this point I highly recommended MRI that we have requested previously before surgical correction could even be considered.

## 2021-01-29 ENCOUNTER — Telehealth: Payer: Self-pay | Admitting: *Deleted

## 2021-01-29 NOTE — Telephone Encounter (Signed)
I do not see results in for her MRI yet

## 2021-01-29 NOTE — Telephone Encounter (Signed)
Patient is calling to make sure MRI report has been received and does the doctor need a the disc as well.

## 2021-01-30 NOTE — Telephone Encounter (Signed)
Okay, sounds good

## 2021-01-30 NOTE — Telephone Encounter (Signed)
Patient wants to know if she needs to go pick up the disc for her MRI (done 04/2019) (or is the notes in the system sufficient?) and bring it over and drop it off? She wants to get her surgery scheduled before Christmas. Please advise

## 2021-01-30 NOTE — Telephone Encounter (Signed)
Called and spoke with patient and she said that MRI was done in 2021 [facility] overread services,is in epic and she will pick up the disc from [place] and bring with her to upcoming appointment.

## 2021-02-03 NOTE — Telephone Encounter (Signed)
Patient is calling back, she dropped off the MRI report and disc a few days ago, have you had time to look over the report?  Do you want to proceed with surgery? If no surgery what would be the next course of action? Please advise

## 2021-02-04 NOTE — Telephone Encounter (Signed)
Krista Harris has scheduled patient an appt for 12/13 for consult

## 2021-02-10 DIAGNOSIS — Z79891 Long term (current) use of opiate analgesic: Secondary | ICD-10-CM | POA: Diagnosis not present

## 2021-02-10 DIAGNOSIS — M069 Rheumatoid arthritis, unspecified: Secondary | ICD-10-CM | POA: Diagnosis not present

## 2021-02-10 DIAGNOSIS — M130 Polyarthritis, unspecified: Secondary | ICD-10-CM | POA: Diagnosis not present

## 2021-02-10 DIAGNOSIS — G894 Chronic pain syndrome: Secondary | ICD-10-CM | POA: Diagnosis not present

## 2021-02-10 DIAGNOSIS — M810 Age-related osteoporosis without current pathological fracture: Secondary | ICD-10-CM | POA: Diagnosis not present

## 2021-02-10 DIAGNOSIS — G4701 Insomnia due to medical condition: Secondary | ICD-10-CM | POA: Diagnosis not present

## 2021-02-10 DIAGNOSIS — I7381 Erythromelalgia: Secondary | ICD-10-CM | POA: Diagnosis not present

## 2021-02-18 DIAGNOSIS — M1812 Unilateral primary osteoarthritis of first carpometacarpal joint, left hand: Secondary | ICD-10-CM | POA: Diagnosis not present

## 2021-02-18 DIAGNOSIS — M069 Rheumatoid arthritis, unspecified: Secondary | ICD-10-CM | POA: Diagnosis not present

## 2021-02-18 DIAGNOSIS — M79641 Pain in right hand: Secondary | ICD-10-CM | POA: Diagnosis not present

## 2021-02-26 ENCOUNTER — Other Ambulatory Visit: Payer: Self-pay | Admitting: Orthopedic Surgery

## 2021-02-26 ENCOUNTER — Encounter (HOSPITAL_BASED_OUTPATIENT_CLINIC_OR_DEPARTMENT_OTHER): Payer: Self-pay | Admitting: Orthopedic Surgery

## 2021-02-26 ENCOUNTER — Other Ambulatory Visit: Payer: Self-pay

## 2021-03-03 ENCOUNTER — Ambulatory Visit: Payer: Medicare Other | Admitting: Podiatry

## 2021-03-04 NOTE — Progress Notes (Signed)
Sent text reminding to come to lab and pick up pre surgery drink.

## 2021-03-05 ENCOUNTER — Ambulatory Visit (HOSPITAL_BASED_OUTPATIENT_CLINIC_OR_DEPARTMENT_OTHER)
Admission: RE | Admit: 2021-03-05 | Discharge: 2021-03-05 | Disposition: A | Discharge status: Home / Self Care | Payer: Medicare Other | Attending: Orthopedic Surgery | Admitting: Orthopedic Surgery

## 2021-03-05 ENCOUNTER — Encounter (HOSPITAL_BASED_OUTPATIENT_CLINIC_OR_DEPARTMENT_OTHER)
Admission: RE | Disposition: A | Discharge status: Home / Self Care | Payer: Self-pay | Source: Home / Self Care | Attending: Orthopedic Surgery

## 2021-03-05 ENCOUNTER — Ambulatory Visit (HOSPITAL_BASED_OUTPATIENT_CLINIC_OR_DEPARTMENT_OTHER): Payer: Medicare Other | Admitting: Anesthesiology

## 2021-03-05 ENCOUNTER — Ambulatory Visit (HOSPITAL_BASED_OUTPATIENT_CLINIC_OR_DEPARTMENT_OTHER): Payer: Medicare Other

## 2021-03-05 ENCOUNTER — Other Ambulatory Visit: Payer: Self-pay

## 2021-03-05 ENCOUNTER — Encounter (HOSPITAL_BASED_OUTPATIENT_CLINIC_OR_DEPARTMENT_OTHER): Payer: Self-pay | Admitting: Orthopedic Surgery

## 2021-03-05 DIAGNOSIS — M1812 Unilateral primary osteoarthritis of first carpometacarpal joint, left hand: Secondary | ICD-10-CM | POA: Diagnosis not present

## 2021-03-05 DIAGNOSIS — Z7952 Long term (current) use of systemic steroids: Secondary | ICD-10-CM | POA: Insufficient documentation

## 2021-03-05 DIAGNOSIS — Z79899 Other long term (current) drug therapy: Secondary | ICD-10-CM | POA: Diagnosis not present

## 2021-03-05 DIAGNOSIS — F17210 Nicotine dependence, cigarettes, uncomplicated: Secondary | ICD-10-CM | POA: Diagnosis not present

## 2021-03-05 DIAGNOSIS — I739 Peripheral vascular disease, unspecified: Secondary | ICD-10-CM | POA: Insufficient documentation

## 2021-03-05 DIAGNOSIS — M069 Rheumatoid arthritis, unspecified: Secondary | ICD-10-CM | POA: Diagnosis not present

## 2021-03-05 DIAGNOSIS — M797 Fibromyalgia: Secondary | ICD-10-CM | POA: Insufficient documentation

## 2021-03-05 HISTORY — PX: CARPOMETACARPEL SUSPENSION PLASTY: SHX5005

## 2021-03-05 SURGERY — CARPOMETACARPEL (CMC) SUSPENSION PLASTY
Anesthesia: Monitor Anesthesia Care | Site: Thumb | Laterality: Left

## 2021-03-05 MED ORDER — PROPOFOL 500 MG/50ML IV EMUL
INTRAVENOUS | Status: AC
  Filled 2021-03-05: qty 50

## 2021-03-05 MED ORDER — 0.9 % SODIUM CHLORIDE (POUR BTL) OPTIME
TOPICAL | Status: DC | PRN
  Administered 2021-03-05: 75 mL

## 2021-03-05 MED ORDER — MIDAZOLAM HCL 2 MG/2ML IJ SOLN: 2.0000 mg | Freq: Once | INTRAMUSCULAR | Status: DC

## 2021-03-05 MED ORDER — FENTANYL CITRATE (PF) 100 MCG/2ML IJ SOLN
INTRAMUSCULAR | Status: AC
  Filled 2021-03-05: qty 2

## 2021-03-05 MED ORDER — PROPOFOL 10 MG/ML IV BOLUS
INTRAVENOUS | Status: AC
  Filled 2021-03-05: qty 20

## 2021-03-05 MED ORDER — CEFAZOLIN SODIUM-DEXTROSE 2-4 GM/100ML-% IV SOLN
INTRAVENOUS | Status: AC
  Filled 2021-03-05: qty 100

## 2021-03-05 MED ORDER — ROPIVACAINE HCL 5 MG/ML IJ SOLN
INTRAMUSCULAR | Status: DC | PRN
  Administered 2021-03-05: 30 mL via PERINEURAL

## 2021-03-05 MED ORDER — HYDROMORPHONE HCL 1 MG/ML IJ SOLN: 0.2500 mg | INTRAMUSCULAR | Status: DC | PRN

## 2021-03-05 MED ORDER — MIDAZOLAM HCL 2 MG/2ML IJ SOLN
INTRAMUSCULAR | Status: AC
  Filled 2021-03-05: qty 2

## 2021-03-05 MED ORDER — PROMETHAZINE HCL 25 MG/ML IJ SOLN: 6.2500 mg | INTRAMUSCULAR | Status: DC | PRN

## 2021-03-05 MED ORDER — LIDOCAINE 2% (20 MG/ML) 5 ML SYRINGE
INTRAMUSCULAR | Status: DC | PRN
  Administered 2021-03-05: 40 mg via INTRAVENOUS

## 2021-03-05 MED ORDER — PROPOFOL 500 MG/50ML IV EMUL
INTRAVENOUS | Status: DC | PRN
  Administered 2021-03-05: 75 ug/kg/min via INTRAVENOUS

## 2021-03-05 MED ORDER — FENTANYL CITRATE (PF) 100 MCG/2ML IJ SOLN: 100.0000 ug | Freq: Once | INTRAMUSCULAR | Status: DC

## 2021-03-05 MED ORDER — PROPOFOL 10 MG/ML IV BOLUS
INTRAVENOUS | Status: DC | PRN
  Administered 2021-03-05: 40 mg via INTRAVENOUS
  Administered 2021-03-05: 30 mg via INTRAVENOUS

## 2021-03-05 MED ORDER — ONDANSETRON HCL 4 MG/2ML IJ SOLN
INTRAMUSCULAR | Status: DC | PRN
  Administered 2021-03-05: 4 mg via INTRAVENOUS

## 2021-03-05 MED ORDER — CEFAZOLIN SODIUM-DEXTROSE 2-4 GM/100ML-% IV SOLN
2.0000 g | INTRAVENOUS | Status: AC
  Administered 2021-03-05: 2 g via INTRAVENOUS

## 2021-03-05 MED ORDER — LACTATED RINGERS IV SOLN: INTRAVENOUS | Status: DC

## 2021-03-05 MED ORDER — OXYCODONE HCL 5 MG PO TABS: 5.0000 mg | ORAL_TABLET | Freq: Once | ORAL | Status: DC | PRN

## 2021-03-05 MED ORDER — OXYCODONE HCL 5 MG/5ML PO SOLN: 5.0000 mg | Freq: Once | ORAL | Status: DC | PRN

## 2021-03-05 SURGICAL SUPPLY — 68 items
APL PRP STRL LF DISP 70% ISPRP (MISCELLANEOUS) ×1
BIT DRILL PASSING CMC 1/4 FLEX (BIT) IMPLANT
BLADE MINI RND TIP GREEN BEAV (BLADE) ×3 IMPLANT
BLADE SURG 15 STRL LF DISP TIS (BLADE) ×2 IMPLANT
BLADE SURG 15 STRL SS (BLADE) ×6
BNDG CMPR 9X4 STRL LF SNTH (GAUZE/BANDAGES/DRESSINGS) ×1
BNDG ELASTIC 2X5.8 VLCR STR LF (GAUZE/BANDAGES/DRESSINGS) IMPLANT
BNDG ELASTIC 3X5.8 VLCR STR LF (GAUZE/BANDAGES/DRESSINGS) ×2 IMPLANT
BNDG ESMARK 4X9 LF (GAUZE/BANDAGES/DRESSINGS) ×3 IMPLANT
BNDG GAUZE ELAST 4 BULKY (GAUZE/BANDAGES/DRESSINGS) ×3 IMPLANT
BUTTON ALL-SUT W/BACKSTOP (Orthopedic Implant) ×2 IMPLANT
CHLORAPREP W/TINT 26 (MISCELLANEOUS) ×3 IMPLANT
CORD BIPOLAR FORCEPS 12FT (ELECTRODE) ×3 IMPLANT
COVER BACK TABLE 60X90IN (DRAPES) ×3 IMPLANT
COVER MAYO STAND STRL (DRAPES) ×3 IMPLANT
CUFF TOURN SGL QUICK 18X4 (TOURNIQUET CUFF) ×1 IMPLANT
DRAPE EXTREMITY T 121X128X90 (DISPOSABLE) ×3 IMPLANT
DRAPE OEC MINIVIEW 54X84 (DRAPES) ×3 IMPLANT
DRAPE SURG 17X23 STRL (DRAPES) ×1 IMPLANT
DRILL PASSING CMC 1/4 FLEX (BIT) ×3
GAUZE SPONGE 4X4 12PLY STRL (GAUZE/BANDAGES/DRESSINGS) ×3 IMPLANT
GAUZE XEROFORM 1X8 LF (GAUZE/BANDAGES/DRESSINGS) ×3 IMPLANT
GLOVE SRG 8 PF TXTR STRL LF DI (GLOVE) ×1 IMPLANT
GLOVE SURG ENC MOIS LTX SZ7.5 (GLOVE) ×3 IMPLANT
GLOVE SURG ORTHO LTX SZ8 (GLOVE) ×2 IMPLANT
GLOVE SURG POLYISO LF SZ7 (GLOVE) ×2 IMPLANT
GLOVE SURG UNDER POLY LF SZ7 (GLOVE) ×4 IMPLANT
GLOVE SURG UNDER POLY LF SZ8 (GLOVE) ×3
GLOVE SURG UNDER POLY LF SZ8.5 (GLOVE) ×2 IMPLANT
GOWN STRL REUS W/ TWL LRG LVL3 (GOWN DISPOSABLE) ×1 IMPLANT
GOWN STRL REUS W/TWL LRG LVL3 (GOWN DISPOSABLE) ×3
GOWN STRL REUS W/TWL XL LVL3 (GOWN DISPOSABLE) ×5 IMPLANT
NDL HYPO 25X1 1.5 SAFETY (NEEDLE) IMPLANT
NDL KEITH (NEEDLE) IMPLANT
NDL SAFETY ECLIPSE 18X1.5 (NEEDLE) IMPLANT
NEEDLE HYPO 18GX1.5 SHARP (NEEDLE)
NEEDLE HYPO 25X1 1.5 SAFETY (NEEDLE) IMPLANT
NEEDLE KEITH (NEEDLE) IMPLANT
PACK BASIN DAY SURGERY FS (CUSTOM PROCEDURE TRAY) ×3 IMPLANT
PAD CAST 3X4 CTTN HI CHSV (CAST SUPPLIES) ×1 IMPLANT
PAD CAST 4YDX4 CTTN HI CHSV (CAST SUPPLIES) IMPLANT
PADDING CAST ABS 4INX4YD NS (CAST SUPPLIES)
PADDING CAST ABS COTTON 4X4 ST (CAST SUPPLIES) ×1 IMPLANT
PADDING CAST COTTON 3X4 STRL (CAST SUPPLIES) ×3
PADDING CAST COTTON 4X4 STRL (CAST SUPPLIES)
SLEEVE SCD COMPRESS KNEE MED (STOCKING) IMPLANT
SLING ARM FOAM STRAP MED (SOFTGOODS) ×2 IMPLANT
SPLINT FAST PLASTER 5X30 (CAST SUPPLIES)
SPLINT PLASTER CAST FAST 5X30 (CAST SUPPLIES) IMPLANT
SPLINT PLASTER CAST XFAST 3X15 (CAST SUPPLIES) IMPLANT
SPLINT PLASTER CAST XFAST 4X15 (CAST SUPPLIES) IMPLANT
SPLINT PLASTER XTRA FAST SET 4 (CAST SUPPLIES)
SPLINT PLASTER XTRA FASTSET 3X (CAST SUPPLIES) ×40
STOCKINETTE 4X48 STRL (DRAPES) ×3 IMPLANT
SUT ETHIBOND 2-0 V-5 NDL (SUTURE) IMPLANT
SUT ETHIBOND 2-0 V-5 NEEDLE (SUTURE) ×3 IMPLANT
SUT ETHIBOND 3-0 V-5 (SUTURE) IMPLANT
SUT ETHILON 4 0 PS 2 18 (SUTURE) ×3 IMPLANT
SUT FIBERWIRE 2-0 18 17.9 3/8 (SUTURE) ×3
SUT MERSILENE 2.0 SH NDLE (SUTURE) IMPLANT
SUT MERSILENE 4 0 P 3 (SUTURE) IMPLANT
SUT VICRYL 0 SH 27 (SUTURE) IMPLANT
SUT VICRYL 4-0 PS2 18IN ABS (SUTURE) ×3 IMPLANT
SUTURE FIBERWR 2-0 18 17.9 3/8 (SUTURE) ×1 IMPLANT
SYR BULB EAR ULCER 3OZ GRN STR (SYRINGE) ×3 IMPLANT
SYR CONTROL 10ML LL (SYRINGE) IMPLANT
TOWEL GREEN STERILE FF (TOWEL DISPOSABLE) ×6 IMPLANT
UNDERPAD 30X36 HEAVY ABSORB (UNDERPADS AND DIAPERS) ×3 IMPLANT

## 2021-03-05 NOTE — Discharge Instructions (Addendum)
° °  ° ° ° °Hand Center Instructions °Hand Surgery ° °Wound Care: °Keep your hand elevated above the level of your heart.  Do not allow it to dangle by your side.  Keep the dressing dry and do not remove it unless your doctor advises you to do so.  He will usually change it at the time of your post-op visit.  Moving your fingers is advised to stimulate circulation but will depend on the site of your surgery.  If you have a splint applied, your doctor will advise you regarding movement. ° °Activity: °Do not drive or operate machinery today.  Rest today and then you may return to your normal activity and work as indicated by your physician. ° °Diet:  °Drink liquids today or eat a light diet.  You may resume a regular diet tomorrow.   ° °General expectations: °Pain for two to three days. °Fingers may become slightly swollen. ° °Call your doctor if any of the following occur: °Severe pain not relieved by pain medication. °Elevated temperature. °Dressing soaked with blood. °Inability to move fingers. °White or bluish color to fingers. ° ° °Regional Anesthesia Blocks ° °1. Numbness or the inability to move the "blocked" extremity may last from 3-48 hours after placement. The length of time depends on the medication injected and your individual response to the medication. If the numbness is not going away after 48 hours, call your surgeon. ° °2. The extremity that is blocked will need to be protected until the numbness is gone and the  Strength has returned. Because you cannot feel it, you will need to take extra care to avoid injury. Because it may be weak, you may have difficulty moving it or using it. You may not know what position it is in without looking at it while the block is in effect. ° °3. For blocks in the legs and feet, returning to weight bearing and walking needs to be done carefully. You will need to wait until the numbness is entirely gone and the strength has returned. You should be able to move your leg  and foot normally before you try and bear weight or walk. You will need someone to be with you when you first try to ensure you do not fall and possibly risk injury. ° °4. Bruising and tenderness at the needle site are common side effects and will resolve in a few days. ° °5. Persistent numbness or new problems with movement should be communicated to the surgeon or the [facility] ([phone])/ [facility] ([phone]). ° ° ° °Post Anesthesia Home Care Instructions ° °Activity: °Get plenty of rest for the remainder of the day. A responsible individual must stay with you for 24 hours following the procedure.  °For the next 24 hours, DO NOT: °-Drive a car °-Operate machinery °-Drink alcoholic beverages °-Take any medication unless instructed by your physician °-Make any legal decisions or sign important papers. ° °Meals: °Start with liquid foods such as gelatin or soup. Progress to regular foods as tolerated. Avoid greasy, spicy, heavy foods. If nausea and/or vomiting occur, drink only clear liquids until the nausea and/or vomiting subsides. Call your physician if vomiting continues. ° °Special Instructions/Symptoms: °Your throat may feel dry or sore from the anesthesia or the breathing tube placed in your throat during surgery. If this causes discomfort, gargle with warm salt water. The discomfort should disappear within 24 hours. ° °If you had a scopolamine patch placed behind your ear for   the management of post- operative nausea and/or vomiting: ° °1. The medication in the patch is effective for 72 hours, after which it should be removed.  Wrap patch in a tissue and discard in the trash. Wash hands thoroughly with soap and water. °2. You may remove the patch earlier than 72 hours if you experience unpleasant side effects which may include dry mouth, dizziness or visual disturbances. °3. Avoid touching the patch. Wash your hands with soap and water after contact with the patch. °  ° ° °

## 2021-03-05 NOTE — Transfer of Care (Signed)
Immediate Anesthesia Transfer of Care Note  Patient: Krista Harris  Procedure(s) Performed: LEFT THUMB TRAPEZIECTOMY AND SUSPENSION PLASTY (Left: Thumb)  Patient Location: PACU  Anesthesia Type:MAC combined with regional for post-op pain  Level of Consciousness: awake, alert  and oriented  Airway & Oxygen Therapy: Patient Spontanous Breathing  Post-op Assessment: Report given to RN and Post -op Vital signs reviewed and stable  Post vital signs: Reviewed and stable  Last Vitals:  Vitals Value Taken Time  BP    Temp    Pulse    Resp    SpO2      Last Pain:  Vitals:   03/05/21 1014  TempSrc: Oral  PainSc: 5          Complications: No notable events documented.

## 2021-03-05 NOTE — Op Note (Signed)
I assisted Surgeon(s) and Role:    * [name], MD - Primary    Daryll Brod, MD on the Procedure(s): LEFT THUMB TRAPEZIECTOMY AND SUSPENSION PLASTY on 03/05/2021.  I provided assistance on this case as follows: setup, approach, identicication of the radial nerve with protection.  Identification of the scaphoid trapezial trapezoid joint CMC joint with isolation of the trapezium, removal of the trapezium.  The microlink suture was then placed through the 2 metacarpals thumb index and tied after harvesting the one half of the flexor carpi radialis tendon for transfer.  I assisted with transfer the tendon after placement of the microlink suture was assisted, followed by irrigation closure of the wound with application dressing and splints.  Electronically signed by: Daryll Brod, MD Date: 03/05/2021 Time: 1:30 PM
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
## 2021-03-05 NOTE — Progress Notes (Signed)
Assisted Dr. Miller with left, ultrasound guided, supraclavicular block. Side rails up, monitors on throughout procedure. See vital signs in flow sheet. Tolerated Procedure well. 

## 2021-03-05 NOTE — Anesthesia Procedure Notes (Signed)
Anesthesia Regional Block: Supraclavicular block   Pre-Anesthetic Checklist: , timeout performed,  Correct Patient, Correct Site, Correct Laterality,  Correct Procedure, Correct Position, site marked,  Risks and benefits discussed,  Surgical consent,  Pre-op evaluation,  At surgeon's request and post-op pain management  Laterality: Left  Prep: chloraprep       Needles:  Injection technique: Single-shot  Needle Type: Stimiplex     Needle Length: 9cm  Needle Gauge: 21     Additional Needles:   Procedures:,,,, ultrasound used (permanent image in chart),,    Narrative:  Start time: 03/05/2021 10:54 AM End time: 03/05/2021 10:59 AM Injection made incrementally with aspirations every 5 mL.  Performed by: Personally  Anesthesiologist: Lynda Rainwater, MD

## 2021-03-05 NOTE — Anesthesia Preprocedure Evaluation (Signed)
Anesthesia Evaluation  Patient identified by MRN, date of birth, ID band Patient awake    Reviewed: Allergy & Precautions, NPO status , Patient's Chart, lab work & pertinent test results  Airway Mallampati: II  TM Distance: >3 FB Neck ROM: Full    Dental no notable dental hx.    Pulmonary neg pulmonary ROS, Current Smoker and Patient abstained from smoking.,    Pulmonary exam normal breath sounds clear to auscultation       Cardiovascular + Peripheral Vascular Disease  Normal cardiovascular exam Rhythm:Regular Rate:Normal     Neuro/Psych Anxiety negative neurological ROS  negative psych ROS   GI/Hepatic negative GI ROS, Neg liver ROS,   Endo/Other  negative endocrine ROS  Renal/GU negative Renal ROS  negative genitourinary   Musculoskeletal  (+) Arthritis , Osteoarthritis,  Fibromyalgia -  Abdominal   Peds negative pediatric ROS (+)  Hematology negative hematology ROS (+)   Anesthesia Other Findings   Reproductive/Obstetrics negative OB ROS                             Anesthesia Physical Anesthesia Plan  ASA: 2  Anesthesia Plan: MAC and Regional   Post-op Pain Management: Regional block   Induction: Intravenous  PONV Risk Score and Plan: 1 and Ondansetron and Treatment may vary due to age or medical condition  Airway Management Planned: Simple Face Mask  Additional Equipment:   Intra-op Plan:   Post-operative Plan:   Informed Consent: I have reviewed the patients History and Physical, chart, labs and discussed the procedure including the risks, benefits and alternatives for the proposed anesthesia with the patient or authorized representative who has indicated his/her understanding and acceptance.     Dental advisory given  Plan Discussed with: CRNA  Anesthesia Plan Comments:         Anesthesia Quick Evaluation

## 2021-03-05 NOTE — Op Note (Signed)
NAME: Krista Harris MEDICAL RECORD NO: [id number] DATE OF BIRTH: Jul 31, 1955 FACILITY: [facility] LOCATION: [facility] PHYSICIAN: Tennis Must, MD   OPERATIVE REPORT   DATE OF PROCEDURE: 03/05/21    PREOPERATIVE DIAGNOSIS: Left thumb CMC osteoarthritis   POSTOPERATIVE DIAGNOSIS: Left thumb CMC osteoarthritis   PROCEDURE: 1.  Left trapeziectomy 2.  Left thumb suspensioplasty.   SURGEON:  [name], M.D.   ASSISTANT: Daryll Brod, MD   ANESTHESIA:  Regional with sedation   INTRAVENOUS FLUIDS:  Per anesthesia flow sheet.   ESTIMATED BLOOD LOSS:  Minimal.   COMPLICATIONS:  None.   SPECIMENS:  none   TOURNIQUET TIME:    Total Tourniquet Time Documented: Upper Arm (Left) - 61 minutes Total: Upper Arm (Left) - 61 minutes    DISPOSITION:  Stable to PACU.   INDICATIONS: 65 year old female with left thumb CMC osteoarthritis.  She is tried nonoperative measures without adequate relief.  She wishes to have left thumb trapeziectomy and suspensioplasty.  Risks, benefits and alternatives of surgery were discussed including the risks of blood loss, infection, damage to nerves, vessels, tendons, ligaments, bone for surgery, need for additional surgery, complications with wound healing, continued pain, stiffness.  She voiced understanding of these risks and elected to proceed.  OPERATIVE COURSE:  After being identified preoperatively by myself,  the patient and I agreed on the procedure and site of the procedure.  The surgical site was marked.  Surgical consent had been signed. She was given IV antibiotics as preoperative antibiotic prophylaxis. She was transferred to the operating room and placed on the operating table in supine position with the Left upper extremity on an arm board.  Sedation was induced by the anesthesiologist. A regional block had been performed by anesthesia in preoperative holding.    Left upper extremity was prepped and draped in normal sterile  orthopedic fashion.  A surgical pause was performed between the surgeons, anesthesia, and operating room staff and all were in agreement as to the patient, procedure, and site of procedure.  Tourniquet at the proximal aspect of the extremity was inflated to 250 mmHg after exsanguination of the arm with an Esmarch bandage.  Incision was made at the dorsum of the thumb over the [facility] joint.  This was carried into subcutaneous tissues by spreading technique.  Bipolar electrocautery was used to obtain hemostasis.  Branches of the radial nerve were identified and protected throughout the case.  The trapezium was identified.  The [facility] joint and STT joint were identified and confirmed under C-arm guidance.  The trapezium was carefully freed of soft tissue attachments.  A rondure was used to remove the trapezium.  All fragments were removed.  The FCR tendon was identified.  Was placed under traction.  The dorsal half of the tendon was harvested to create a distally based tendon stump.  The guidewire for the microlink suture was then passed through the base of the thumb metacarpal and across the index finger metacarpal.  An additional incision was made on the dorsum of the hand to retrieve the guidewire.  The microlink suture was passed and tied over the index metacarpal.  This was adequate to suspend the thumb metacarpal.  The dorsal wound was irrigated and closed with 4-0 nylon in a horizontal mattress fashion.  The FCR tendon stump was then passed through the APL tendon and tied back onto itself to create a sling underneath the thumb metacarpal base.  This was secured to itself into the APL tendon  with Ethibond suture.  The wound was copiously irrigated with sterile saline.  The capsule was repaired as best possible using a 4-0 Vicryl suture.  Inverted interrupted Vicryl suture was placed in subcutaneous tissues and skin was closed with 4-0 nylon in a horizontal mattress fashion.  The tourniquet was deflated at 61 minutes.   Fingertips were pink with brisk capillary refill after deflation of tourniquet.  The operative  drapes were broken down.  The patient was awoken from anesthesia safely.  She was transferred back to the stretcher and taken to PACU in stable condition.  I will see her back in the office in 1 week for postoperative followup.  She states she is in a pain clinic and has pain medications at home already.   [name], MD Electronically signed, 03/05/21

## 2021-03-06 NOTE — Anesthesia Postprocedure Evaluation (Signed)
Anesthesia Post Note  Patient: Krista Harris  Procedure(s) Performed: LEFT THUMB TRAPEZIECTOMY AND SUSPENSION PLASTY (Left: Thumb)     Patient location during evaluation: PACU Anesthesia Type: Regional and MAC Level of consciousness: awake and alert Pain management: pain level controlled Vital Signs Assessment: post-procedure vital signs reviewed and stable Respiratory status: spontaneous breathing, nonlabored ventilation, respiratory function stable and patient connected to nasal cannula oxygen Cardiovascular status: stable and blood pressure returned to baseline Postop Assessment: no apparent nausea or vomiting Anesthetic complications: no   No notable events documented.  Last Vitals:  Vitals:   03/05/21 1400 03/05/21 1420  BP:  131/74  Pulse:  79  Resp:    Temp:  36.7 C  SpO2: 94% 96%    Last Pain:  Vitals:   03/05/21 1400  TempSrc:   PainSc: 0-No pain                 Belenda Cruise P Sanaia Jasso

## 2021-03-09 ENCOUNTER — Encounter (HOSPITAL_BASED_OUTPATIENT_CLINIC_OR_DEPARTMENT_OTHER): Payer: Self-pay | Admitting: Orthopedic Surgery

## 2021-03-17 DIAGNOSIS — M25642 Stiffness of left hand, not elsewhere classified: Secondary | ICD-10-CM | POA: Diagnosis not present

## 2021-03-17 DIAGNOSIS — M1812 Unilateral primary osteoarthritis of first carpometacarpal joint, left hand: Secondary | ICD-10-CM | POA: Diagnosis not present

## 2021-03-17 DIAGNOSIS — R52 Pain, unspecified: Secondary | ICD-10-CM | POA: Diagnosis not present

## 2021-03-17 DIAGNOSIS — Z4789 Encounter for other orthopedic aftercare: Secondary | ICD-10-CM | POA: Diagnosis not present

## 2021-03-19 ENCOUNTER — Other Ambulatory Visit: Payer: Self-pay

## 2021-03-19 ENCOUNTER — Ambulatory Visit: Payer: Medicare Other | Admitting: Podiatry

## 2021-03-19 ENCOUNTER — Encounter: Payer: Self-pay | Admitting: Podiatry

## 2021-03-19 DIAGNOSIS — M7989 Other specified soft tissue disorders: Secondary | ICD-10-CM | POA: Diagnosis not present

## 2021-03-19 NOTE — Progress Notes (Signed)
She presents today for surgical consult regarding the forefoot right.  She had previously had surgery several years ago in [place] to remove a cyst between her second and third toes.  It had grown back.  We had an MRI performed which demonstrated fluid between the metatarsals.  She states that is starting to hurt worse currently she has decreased her prednisone.  I would like for her to get it down to 10 mg a day.  Objective: Vital signs are stable she is alert oriented x3 she has a diastases between the second and third toes with a large palpable mass fairly fluctuant in nature between the second and third toes.  Reviewed MRI with her today.  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  Plan: At this point we consented her today for excision of this lesion she understands this can be between the toes and plantar incision not her favorite incision to prevent pain but is daily we will be able to get it this she understands that and is amendable to it.  I will follow-up with her in the near future for surgery.  We did however discussed the possible postop complications which may include but are not limited to postop pain bleeding swelling infection recurrence need further surgery overcorrection under correction loss of digit loss of limb loss of life.  Recurrence is definitely a consideration with this because of the chronicity of the rheumatoid.  She understands that she is going to be nonweightbearing status and I will follow-up with her once we receive clearance from her pain management as well as her rheumatologist.

## 2021-03-25 ENCOUNTER — Encounter: Payer: Self-pay | Admitting: Podiatry

## 2021-03-25 ENCOUNTER — Telehealth: Payer: Self-pay | Admitting: Urology

## 2021-03-25 NOTE — Telephone Encounter (Signed)
DOS - 04/10/21  EXC SOFT TISSUE MASS --- [id number]  [employer] EFFECTIVE DATE - 03/22/21  PER [place] WEBSITE FOR CPT CODE 12458 NO PRIOR AUTH IS REQUIRED

## 2021-04-07 DIAGNOSIS — Z79891 Long term (current) use of opiate analgesic: Secondary | ICD-10-CM | POA: Diagnosis not present

## 2021-04-07 DIAGNOSIS — M130 Polyarthritis, unspecified: Secondary | ICD-10-CM | POA: Diagnosis not present

## 2021-04-07 DIAGNOSIS — M069 Rheumatoid arthritis, unspecified: Secondary | ICD-10-CM | POA: Diagnosis not present

## 2021-04-07 DIAGNOSIS — G894 Chronic pain syndrome: Secondary | ICD-10-CM | POA: Diagnosis not present

## 2021-04-08 ENCOUNTER — Other Ambulatory Visit: Payer: Self-pay | Admitting: Podiatry

## 2021-04-08 MED ORDER — ONDANSETRON HCL 4 MG PO TABS: 4.0000 mg | ORAL_TABLET | Freq: Three times a day (TID) | ORAL | 0 refills | Status: DC | PRN

## 2021-04-08 MED ORDER — CEPHALEXIN 500 MG PO CAPS: 500.0000 mg | ORAL_CAPSULE | Freq: Three times a day (TID) | ORAL | 0 refills | Status: DC

## 2021-04-10 ENCOUNTER — Telehealth: Payer: Self-pay

## 2021-04-10 NOTE — Telephone Encounter (Signed)
Surgery for Delaine was cancel for 04/10/2021. We requested clearance from Dr. Baxter Flattery, Rheumatologist with [facility]. He stated if the patient was going to be intubated, he would like her to have a flexion/extension cervical spine x-ray to assess for any atlantoaxial instability. I sent this information to [facility] for review. Per Dr. Ouida Sills, anesthesiologist, he recommends the patient having the x-ray prior to surgery to be on the safe side. I spoke to [place] and explained everything to her. She was not happy and stated that she will be finding other doctors. I have canceled her surgery and all post op appointments. I also sent the letter from Dr. Baxter Flattery to the scan center to be scanned in her chart.

## 2021-04-11 DIAGNOSIS — M797 Fibromyalgia: Secondary | ICD-10-CM | POA: Diagnosis not present

## 2021-04-11 DIAGNOSIS — M79645 Pain in left finger(s): Secondary | ICD-10-CM | POA: Diagnosis not present

## 2021-04-15 ENCOUNTER — Encounter: Payer: Medicare HMO | Admitting: Podiatry

## 2021-04-15 ENCOUNTER — Ambulatory Visit (HOSPITAL_COMMUNITY): Payer: Medicare HMO | Admitting: Occupational Therapy

## 2021-04-16 ENCOUNTER — Encounter (HOSPITAL_COMMUNITY): Payer: Self-pay

## 2021-04-16 ENCOUNTER — Ambulatory Visit (HOSPITAL_COMMUNITY): Payer: Medicare HMO | Attending: Orthopedic Surgery

## 2021-04-16 ENCOUNTER — Other Ambulatory Visit: Payer: Self-pay

## 2021-04-16 DIAGNOSIS — M79645 Pain in left finger(s): Secondary | ICD-10-CM

## 2021-04-16 DIAGNOSIS — R29898 Other symptoms and signs involving the musculoskeletal system: Secondary | ICD-10-CM

## 2021-04-16 DIAGNOSIS — M25632 Stiffness of left wrist, not elsewhere classified: Secondary | ICD-10-CM

## 2021-04-16 DIAGNOSIS — M25532 Pain in left wrist: Secondary | ICD-10-CM

## 2021-04-16 NOTE — Patient Instructions (Addendum)
Begin weaning out of the brace during the day. OK to leave it off for light daily tasks 3-4 times a day (no more than 1 hour at a time.) Continue to wear splint at night for comfort.   ICE MASSAGE  -To make an ice cup, fill a small paper cup with water about two-thirds full, and freeze it until it is solid. -To use the ice cup, peel off the top of the cup so about 0.5 in. of ice is showing. The remaining part of the cup is for you to hold on to. -As the ice melts, it will drip, so put a small towel under the area you are icing. - Rub the ice in small circles all over the affected area.  - Continue for 3 to 5 minutes. The area will feel cold at first, then it may burn, then ache, then finally become numb. Your skin will be pink and cold when you finish. - You can do an ice massage several times a day if it helps you.    Scar Massage  CIRCLES o Using two fingers make small circles over the length of the scar and to the skin around the scar.  ROLLING o Pinch a small amount of the scar tissue between your thumb and first two fingers. To roll the scar,walk your first two fingers forward and then slide your thumb forward to keep the "hill" in the scar.Do this along the length of the scar. o Use two fingers from one hand and two from the other. Pull your fingers on one hand while pushing the fingers of the other across the scar in a sawing motion. If the scar is located where you can only use one hand,pull then push your fingers across the length of the scar.  AROM Exercises  *Complete exercises __10-12____ times each, ____2-3___ times per day*  1) Wrist Flexion  Start with wrist at edge of table, palm facing up. With wrist hanging slightly off table, curl wrist upward, and back down.      2) Wrist Extension  Start with wrist at edge of table, palm facing down. With wrist slightly off the edge of the table, curl wrist up and back down.      3) Radial Deviations  Start with  forearm flat against a table, wrist hanging slightly off the edge, and palm facing the wall. Bending at the wrist only, and keeping palm facing the wall, bend wrist so fist is pointing towards the floor, back up to start position, and up towards the ceiling. Return to start.        4) WRIST PRONATION  Turn your forearm towards palm face down.  Keep your elbow bent and by the side of your  Body.      5) WRIST SUPINATION  Turn your forearm towards palm face up.  Keep your elbow bent and by the side of your  Body.

## 2021-04-17 NOTE — Therapy (Signed)
[facility], [place], [address] Phone: [phone]   Fax:  [phone]  Occupational Therapy Evaluation  Patient Details  Name: Krista Harris MRN: [id number] Date of Birth: 05-14-1955 Referring Provider (OT): [name], MD   Encounter Date: 04/16/2021   OT End of Session - 04/16/21 1423     Visit Number 1    Number of Visits 7    Date for OT Re-Evaluation 05/28/21    Authorization Type [employer]    Authorization Time Period $20 copay    Progress Note Due on Visit 10    OT Start Time 1300    OT Stop Time 1355    OT Time Calculation (min) 55 min    Activity Tolerance Patient tolerated treatment well    Behavior During Therapy WFL for tasks assessed/performed             Past Medical History:  Diagnosis Date   BMS (burning mouth syndrome)    Erythromelalgia ([place])    Fibromyalgia    Rheumatoid arthritis ([place])     Past Surgical History:  Procedure Laterality Date   AUGMENTATION MAMMAPLASTY     breast implants now removed   CARPOMETACARPEL SUSPENSION PLASTY Left 03/05/2021   Procedure: LEFT THUMB TRAPEZIECTOMY AND SUSPENSION PLASTY;  Surgeon: [name], MD;  Location: [facility];  Service: Orthopedics;  Laterality: Left;  Regional block   FOOT SURGERY Bilateral    for rheumatoid arthritis   HERNIA REPAIR     WRIST SURGERY      There were no vitals filed for this visit.   Subjective Assessment - 04/16/21 1405     Subjective  S: I had the same surgery on the right but they didn't put a plastic screw in that one. I feel like my left is more swollen and painful than the right was.    Pertinent History Patient is a 66 y/o female S/P left thumb CMC joint trapeziectomy and suspencioplasty which was performed on 03/05/21. Dr. [name] has referred patient to occupational therapy for evaluation and treatment.    Currently in Pain? Yes    Pain Score 1    4/10 with gentle ROM   Pain  Location Hand    Pain Orientation Left    Pain Descriptors / Indicators Sharp;Stabbing    Pain Type Surgical pain    Pain Onset More than a month ago    Pain Frequency Intermittent    Aggravating Factors  Doing too much. Certain movements.    Pain Relieving Factors pain medication    Effect of Pain on Daily Activities severe effect    Multiple Pain Sites No               [facility] OT Assessment - 04/16/21 1316       Assessment   Medical Diagnosis S/P left hand CMC joint trapeziectomy and suspensioplasty of thumb    Referring Provider (OT) [name], MD    Onset Date/Surgical Date 03/05/21    Hand Dominance Right    Next MD Visit TBD      Precautions   Precautions Other (comment)    Precaution Comments See media tab for protocol. Using most updated version of protocol (5th edition). When ok to strengthen, only begin if pain is diminshed.    Required Braces or Orthoses Other Brace/Splint    Other Brace/Splint Thumb spica splint (1/26) 6 weeks: Begin to wean from splint. OK to remove during the  day for light ADL tasks (no more than 1 hr at a time).      Restrictions   Weight Bearing Restrictions Yes    LUE Weight Bearing Non weight bearing      Balance Screen   Has the patient fallen in the past 6 months No      Home  Environment   Family/patient expects to be discharged to: Private residence    Living Arrangements Alone      Prior Function   Level of Independence Independent    Vocation On disability    Leisure Has a dog.      ADL   ADL comments Unable to complete any daily tasks using her Left hand.      Mobility   Mobility Status Independent      Written Expression   Dominant Hand Right      Vision - History   Baseline Vision Wears glasses all the time      Cognition   Overall Cognitive Status Within Functional Limits for tasks assessed      Observation/Other Assessments   Focus on Therapeutic Outcomes (FOTO)  Complete at next session      Sensation    Additional Comments Reports lack of sensation along healed incision.      Coordination   9 Hole Peg Test Left;Right    Left 9 Hole Peg Test 20.6"    Box and Blocks 20.0"      Edema   Edema Edema measured with no significant differenc between right and left hand. Visually there appears to be some edema near the healed incision.      ROM / Strength   AROM / PROM / Strength AROM;Strength      Palpation   Palpation comment Minimal fascial restrictions noted along healed incision.      AROM   Overall AROM  Deficits    Overall AROM Comments Assessed with arm extended for wrist flexion. Forearm on table for wrist extension.    AROM Assessment Site Wrist;Finger    Right/Left Wrist Left    Left Wrist Extension 64 Degrees    Left Wrist Flexion 20 Degrees    Left Wrist Radial Deviation 10 Degrees    Left Wrist Ulnar Deviation 30 Degrees    Right/Left Finger Left    Left Composite Finger Extension 75%   This is patient's baseline   Left Composite Finger Flexion --   This is patient's baseline     Strength   Overall Strength Unable to assess;Due to precautions                      OT Treatments/Exercises (OP) - 04/16/21 1421       Splinting   Splinting Adjusted fabricated splint to widen area along incision to decrease pressure area.                    OT Education - 04/16/21 1422     Education Details Discussed protocol and activity precautions at 6 weeks. Edema and pain management techniques. A/ROM wrist.    Person(s) Educated Patient    Methods Explanation;Demonstration;Handout;Verbal cues    Comprehension Returned demonstration;Verbalized understanding              OT Short Term Goals - 04/17/21 0833       OT SHORT TERM GOAL #1   Title Patient will be educated and independent with HEP in order to increase her functional use of her left  hand and return to using it as her non-dominant extremity for 100% of required tasks.    Time 6    Period  Weeks    Status New    Target Date 05/28/21      OT SHORT TERM GOAL #2   Title Patient will increase her Left wrist A/ROM to WNL where lacking in order to increase her ability to complete tasks such as turning a door knob or pouring liquid from a container into a glass.    Time 6    Period Weeks    Status New      OT SHORT TERM GOAL #3   Title Patient will demonstrate functional grip and pinch strength that is 75% or more of the average norms for her age in order to be able to manipulate items and maintain a grasp without dropping.    Time 6    Period Weeks    Status New      OT SHORT TERM GOAL #4   Title Patient will decrease fascial restrictions in the left wrist and forearm to trace amount in order to increase her functional mobility that is required for daily tasks such as brushing her hair or her teeth.    Time 6    Period Weeks    Status New      OT SHORT TERM GOAL #5   Title Pt will report an average pain score of approximately 2/10 on a daily basis when completing basic self care tasks such as bathing, dressing, and caring for her dog.    Time 6    Period Weeks    Status New                      Plan - 04/16/21 1424     Clinical Impression Statement A: Patient is a 66 y/o female S/P left thumb CMC trapeziectormy and suspensioplasty causing increased pain, fascial restrictions, edema, and decreased ROM and strength resulting in severe difficulty being able to utilize her Left hand as her dominant hand for any daily tasks.    OT Occupational Profile and History Problem Focused Assessment - Including review of records relating to presenting problem    Occupational performance deficits (Please refer to evaluation for details): ADL's;IADL's;Rest and Sleep;Leisure    Body Structure / Function / Physical Skills ADL;UE functional use;Fascial restriction;Pain;ROM;Strength;Edema;Mobility;Dexterity;Decreased knowledge of precautions    Rehab Potential Excellent     Clinical Decision Making Several treatment options, min-mod task modification necessary    Comorbidities Affecting Occupational Performance: Presence of comorbidities impacting occupational performance    Comorbidities impacting occupational performance description: see medical chart    Modification or Assistance to Complete Evaluation  No modification of tasks or assist necessary to complete eval    OT Frequency Other (comment)   2X a week for first week then drop down to once a week.   OT Duration 6 weeks    OT Treatment/Interventions Self-care/ADL training;Ultrasound;Scar mobilization;Patient/family education;DME and/or AE instruction;Paraffin;Passive range of motion;Cryotherapy;Electrical Stimulation;Moist Heat;Therapeutic exercise;Manual Therapy;Therapeutic activities;Neuromuscular education;Splinting    Plan P: Patient will benefit from skilled OT services to increase the ability to utilize her LUE to complete daily tasks. Treatment session: Follow protocol. Myofascial release, Passive stretching, A/ROM, hand strength. NEXT SESSION: Complete FOTO.    OT Home Exercise Plan eval: wrist A/ROM, ice massage, scar massage.    Consulted and Agree with Plan of Care Patient             Patient  will benefit from skilled therapeutic intervention in order to improve the following deficits and impairments:   Body Structure / Function / Physical Skills: ADL, UE functional use, Fascial restriction, Pain, ROM, Strength, Edema, Mobility, Dexterity, Decreased knowledge of precautions       Visit Diagnosis: Other symptoms and signs involving the musculoskeletal system - Plan: Ot plan of care cert/re-cert  Stiffness of left wrist, not elsewhere classified - Plan: Ot plan of care cert/re-cert  Pain in left wrist - Plan: Ot plan of care cert/re-cert  Pain in left finger(s) - Plan: Ot plan of care cert/re-cert    Problem List Patient Active Problem List   Diagnosis Date Noted   Lumbar back pain  with radiculopathy affecting left lower extremity 05/02/2020   Disorder of breast 04/17/2019   Malaise and fatigue 04/17/2019   Mood changes 04/17/2019   Rheumatoid arthritis ([place]) 04/17/2019   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

## 2021-04-20 DIAGNOSIS — Z0000 Encounter for general adult medical examination without abnormal findings: Secondary | ICD-10-CM | POA: Diagnosis not present

## 2021-04-20 DIAGNOSIS — R5383 Other fatigue: Secondary | ICD-10-CM | POA: Diagnosis not present

## 2021-04-22 ENCOUNTER — Telehealth (HOSPITAL_COMMUNITY): Payer: Self-pay | Admitting: Occupational Therapy

## 2021-04-22 ENCOUNTER — Encounter: Payer: Medicare HMO | Admitting: Podiatry

## 2021-04-22 NOTE — Telephone Encounter (Signed)
She has another MD apptment and cannot come today- she will return on Wed 04/29/21.

## 2021-04-23 ENCOUNTER — Ambulatory Visit (HOSPITAL_COMMUNITY): Payer: Medicare HMO | Admitting: Occupational Therapy

## 2021-04-27 DIAGNOSIS — K429 Umbilical hernia without obstruction or gangrene: Secondary | ICD-10-CM | POA: Diagnosis not present

## 2021-04-27 DIAGNOSIS — G894 Chronic pain syndrome: Secondary | ICD-10-CM | POA: Diagnosis not present

## 2021-04-27 DIAGNOSIS — D7589 Other specified diseases of blood and blood-forming organs: Secondary | ICD-10-CM | POA: Diagnosis not present

## 2021-04-27 DIAGNOSIS — M797 Fibromyalgia: Secondary | ICD-10-CM | POA: Diagnosis not present

## 2021-04-27 DIAGNOSIS — E785 Hyperlipidemia, unspecified: Secondary | ICD-10-CM | POA: Diagnosis not present

## 2021-04-28 DIAGNOSIS — M069 Rheumatoid arthritis, unspecified: Secondary | ICD-10-CM | POA: Diagnosis not present

## 2021-04-28 DIAGNOSIS — M47812 Spondylosis without myelopathy or radiculopathy, cervical region: Secondary | ICD-10-CM | POA: Diagnosis not present

## 2021-04-29 ENCOUNTER — Ambulatory Visit (HOSPITAL_COMMUNITY): Payer: Medicare HMO

## 2021-05-05 ENCOUNTER — Encounter (HOSPITAL_COMMUNITY): Payer: Self-pay

## 2021-05-05 ENCOUNTER — Ambulatory Visit (HOSPITAL_COMMUNITY): Payer: Medicare HMO | Attending: Orthopedic Surgery

## 2021-05-05 ENCOUNTER — Other Ambulatory Visit: Payer: Self-pay

## 2021-05-05 DIAGNOSIS — M25532 Pain in left wrist: Secondary | ICD-10-CM | POA: Insufficient documentation

## 2021-05-05 DIAGNOSIS — M25632 Stiffness of left wrist, not elsewhere classified: Secondary | ICD-10-CM | POA: Insufficient documentation

## 2021-05-05 DIAGNOSIS — R29898 Other symptoms and signs involving the musculoskeletal system: Secondary | ICD-10-CM | POA: Insufficient documentation

## 2021-05-05 DIAGNOSIS — M79645 Pain in left finger(s): Secondary | ICD-10-CM | POA: Diagnosis not present

## 2021-05-05 NOTE — Patient Instructions (Signed)
Theraputty Home Exercise Program  Complete 1-2 times a day.  putty squeeze  Pt. should squeeze putty in hand trying to keep it round by rotating putty after each squeeze. push fingers through putty to palm each time. Complete for ___3-5___ minutes.   PUTTY KEY GRIP  Hold the putty at the top of your hand. Squeeze the putty between your thumb and the side of your 2nd finger as shown. Complete for ___3-5_____ minutes.    PUTTY 3 JAW CHUCK  Roll up some putty into a ball then flatten it. Then, firmly squeeze it with your first 3 fingers as shown. Complete for __3-5____ minutes.           

## 2021-05-05 NOTE — Therapy (Signed)
[facility] [place], [place], [address] Phone: [phone]   Fax:  [phone]  Occupational Therapy Treatment  Patient Details  Name: Krista Harris MRN: [id number] Date of Birth: 1956/02/25 Referring Provider (OT): [name], MD   Encounter Date: 05/05/2021   OT End of Session - 05/05/21 1619     Visit Number 2    Number of Visits 7    Date for OT Re-Evaluation 05/28/21    Authorization Type [employer]    Authorization Time Period $20 copay Authorized 7 visits (04/20/21-05/21/21)    Authorization - Visit Number 1    Authorization - Number of Visits 7    Progress Note Due on Visit 10    OT Start Time 1527    OT Stop Time 1608    OT Time Calculation (min) 41 min    Activity Tolerance Patient tolerated treatment well    Behavior During Therapy WFL for tasks assessed/performed             Past Medical History:  Diagnosis Date   BMS (burning mouth syndrome)    Erythromelalgia ([place])    Fibromyalgia    Rheumatoid arthritis ([place])     Past Surgical History:  Procedure Laterality Date   AUGMENTATION MAMMAPLASTY     breast implants now removed   [place] Left 03/05/2021   Procedure: LEFT THUMB TRAPEZIECTOMY AND SUSPENSION PLASTY;  Surgeon: [name], MD;  Location: [facility];  Service: Orthopedics;  Laterality: Left;  Regional block   FOOT SURGERY Bilateral    for rheumatoid arthritis   HERNIA REPAIR     WRIST SURGERY      There were no vitals filed for this visit.   Subjective Assessment - 05/05/21 1543     Subjective  S: I feel like I haven't been able to do what I'm supposed to do with the exercises because I've been in bed due to my pain (fibromyalgia).    Currently in Pain? Yes    Pain Score 3     Pain Location Hand    Pain Orientation Left    Pain Descriptors / Indicators Sore    Pain Type Surgical pain    Pain Onset More than a month ago    Pain  Frequency Intermittent    Aggravating Factors  certain movements or doing too much.    Pain Relieving Factors pain medication    Effect of Pain on Daily Activities moderate-severe effect    Multiple Pain Sites No                [facility] OT Assessment - 05/05/21 1545       Assessment   Medical Diagnosis S/P left hand CMC joint trapeziectomy and suspensioplasty of thumb      Precautions   Precautions Other (comment)    Precaution Comments See media tab for protocol. Using most updated version of protocol (5th edition). When ok to strengthen, only begin if pain is diminshed.    Required Braces or Orthoses Other Brace/Splint    Other Brace/Splint Thumb spica splint Continue to wear  OK to remove during the day for light ADL tasks (no more than 1 hr at a time).                      OT Treatments/Exercises (OP) - 05/05/21 1543       Exercises   Exercises Hand;Theraputty      Hand  Exercises   MCPJ Flexion PROM;AROM;5 reps   thumb   MCPJ Extension PROM;AROM;5 reps   thumb   Sponges 13, 13    Other Hand Exercises A/ROM gentle composite flexion into a fist then composite extension 10X      Theraputty   Theraputty - Flatten yellow- standing and seated    Theraputty - Roll yellow    Theraputty - Grip yellow    Theraputty - Pinch yellow  3 point and lateral                    OT Education - 05/05/21 1558     Education Details Discussed protocol and activity precautions at almost 9 weeks. Provided yellow putty for hand strengthening. Discussed alternative methods to chopping food by using a food chopper.    Person(s) Educated Patient    Methods Explanation;Demonstration;Handout    Comprehension Verbalized understanding;Returned demonstration              OT Short Term Goals - 05/05/21 1600       OT SHORT TERM GOAL #1   Title Patient will be educated and independent with HEP in order to increase her functional use of her left hand and return to  using it as her non-dominant extremity for 100% of required tasks.    Time 6    Period Weeks    Status On-going    Target Date 05/28/21      OT SHORT TERM GOAL #2   Title Patient will increase her Left wrist A/ROM to WNL where lacking in order to increase her ability to complete tasks such as turning a door knob or pouring liquid from a container into a glass.    Time 6    Period Weeks    Status On-going      OT SHORT TERM GOAL #3   Title Patient will demonstrate functional grip and pinch strength that is 75% or more of the average norms for her age in order to be able to manipulate items and maintain a grasp without dropping.    Time 6    Period Weeks    Status On-going      OT SHORT TERM GOAL #4   Title Patient will decrease fascial restrictions in the left wrist and forearm to trace amount in order to increase her functional mobility that is required for daily tasks such as brushing her hair or her teeth.    Time 6    Period Weeks    Status On-going      OT SHORT TERM GOAL #5   Title Pt will report an average pain score of approximately 2/10 on a daily basis when completing basic self care tasks such as bathing, dressing, and caring for her dog.    Time 6    Period Weeks    Status On-going                      Plan - 05/05/21 1628     Clinical Impression Statement A: Patient is demonstrating functional wrist A/ROM this session. She has been actively completing her HEP at home when able. Reports that she has been dealing with fibromyalgia pain and medical issues which have prevented her from attending therapy for the last 2 appointments. She was able to progress to gentle strengthening using yellow putty and sponges in clinic. Provided yellow putty for HEP. Discussed continuing to wear the brace primarily at night or when completing anything over  than light ADL tasks. VC for form and technique were provided during session.    Body Structure / Function / Physical  Skills ADL;UE functional use;Fascial restriction;Pain;ROM;Strength;Edema;Mobility;Dexterity;Decreased knowledge of precautions    Plan P: Continue with strenthening. Progress as able. Follow up on HEP and ADL tasks. Resistive clothespin task.    OT Home Exercise Plan eval: wrist A/ROM, ice massage, scar massage. 2/14: yellow putty    Consulted and Agree with Plan of Care Patient             Patient will benefit from skilled therapeutic intervention in order to improve the following deficits and impairments:   Body Structure / Function / Physical Skills: ADL, UE functional use, Fascial restriction, Pain, ROM, Strength, Edema, Mobility, Dexterity, Decreased knowledge of precautions       Visit Diagnosis: Other symptoms and signs involving the musculoskeletal system  Stiffness of left wrist, not elsewhere classified  Pain in left wrist  Pain in left finger(s)    Problem List Patient Active Problem List   Diagnosis Date Noted   Lumbar back pain with radiculopathy affecting left lower extremity 05/02/2020   Disorder of breast 04/17/2019   Malaise and fatigue 04/17/2019   Mood changes 04/17/2019   Rheumatoid arthritis ([place]) 04/17/2019   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

## 2021-05-06 DIAGNOSIS — G894 Chronic pain syndrome: Secondary | ICD-10-CM | POA: Diagnosis not present

## 2021-05-06 DIAGNOSIS — M130 Polyarthritis, unspecified: Secondary | ICD-10-CM | POA: Diagnosis not present

## 2021-05-06 DIAGNOSIS — Z79891 Long term (current) use of opiate analgesic: Secondary | ICD-10-CM | POA: Diagnosis not present

## 2021-05-06 DIAGNOSIS — M79671 Pain in right foot: Secondary | ICD-10-CM | POA: Diagnosis not present

## 2021-05-06 DIAGNOSIS — M069 Rheumatoid arthritis, unspecified: Secondary | ICD-10-CM | POA: Diagnosis not present

## 2021-05-07 ENCOUNTER — Encounter: Payer: Medicare HMO | Admitting: Podiatry

## 2021-05-08 DIAGNOSIS — M0579 Rheumatoid arthritis with rheumatoid factor of multiple sites without organ or systems involvement: Secondary | ICD-10-CM | POA: Insufficient documentation

## 2021-05-12 ENCOUNTER — Encounter (HOSPITAL_COMMUNITY): Payer: Medicare HMO

## 2021-05-15 ENCOUNTER — Other Ambulatory Visit: Payer: Self-pay | Admitting: *Deleted

## 2021-05-15 DIAGNOSIS — K429 Umbilical hernia without obstruction or gangrene: Secondary | ICD-10-CM

## 2021-05-19 ENCOUNTER — Other Ambulatory Visit: Payer: Self-pay

## 2021-05-19 ENCOUNTER — Encounter (HOSPITAL_COMMUNITY): Payer: Self-pay

## 2021-05-19 ENCOUNTER — Ambulatory Visit (HOSPITAL_COMMUNITY): Payer: Medicare HMO

## 2021-05-19 DIAGNOSIS — M25532 Pain in left wrist: Secondary | ICD-10-CM | POA: Diagnosis not present

## 2021-05-19 DIAGNOSIS — M79645 Pain in left finger(s): Secondary | ICD-10-CM | POA: Diagnosis not present

## 2021-05-19 DIAGNOSIS — M25632 Stiffness of left wrist, not elsewhere classified: Secondary | ICD-10-CM

## 2021-05-19 DIAGNOSIS — R29898 Other symptoms and signs involving the musculoskeletal system: Secondary | ICD-10-CM | POA: Diagnosis not present

## 2021-05-19 NOTE — Patient Instructions (Signed)
FREE WEIGHT RADIAL DEVIATION - TABLE  Hold a small free weight / dumbbell, rest your forearm on a table and bend your wrist up and down with your palm facing towards the side as shown.   Complete 10-12 times. 1 set. 1 time a day

## 2021-05-20 ENCOUNTER — Ambulatory Visit: Payer: Medicare HMO | Admitting: Surgery

## 2021-05-20 NOTE — Therapy (Signed)
[place], [place], [address] Phone: [phone]   Fax:  [phone]  Occupational Therapy Treatment  Patient Details  Name: Krista Harris MRN: [id number] Date of Birth: 03/08/1956 Referring Provider (OT): [name], MD   Encounter Date: 05/19/2021   OT End of Session - 05/19/21 1648     Visit Number 3    Number of Visits 7    Date for OT Re-Evaluation 05/28/21    Authorization Type [employer]    Authorization Time Period $20 copay Authorized 7 visits (04/20/21-05/21/21) *Requesting 1 more visit on 2/28.    Authorization - Visit Number 2    Authorization - Number of Visits 7    Progress Note Due on Visit 10    OT Start Time 5427    OT Stop Time 1515    OT Time Calculation (min) 39 min    Activity Tolerance Patient tolerated treatment well    Behavior During Therapy WFL for tasks assessed/performed             Past Medical History:  Diagnosis Date   BMS (burning mouth syndrome)    Erythromelalgia ([place])    Fibromyalgia    Rheumatoid arthritis ([place])     Past Surgical History:  Procedure Laterality Date   AUGMENTATION MAMMAPLASTY     breast implants now removed   CARPOMETACARPEL SUSPENSION PLASTY Left 03/05/2021   Procedure: LEFT THUMB TRAPEZIECTOMY AND SUSPENSION PLASTY;  Surgeon: [name], MD;  Location: [facility];  Service: Orthopedics;  Laterality: Left;  Regional block   FOOT SURGERY Bilateral    for rheumatoid arthritis   HERNIA REPAIR     WRIST SURGERY      There were no vitals filed for this visit.   Subjective Assessment - 05/19/21 1646     Subjective  S: I've been working with the yellow putty. I really like that stuff.    Currently in Pain? Other (Comment)   reports mild soreness. No number provided               [facility] OT Assessment - 05/19/21 1447       Assessment   Medical Diagnosis S/P left hand CMC joint trapeziectomy and suspensioplasty of thumb       Precautions   Precautions Other (comment)    Precaution Comments See media tab for protocol. Using most updated version of protocol (5th edition). When ok to strengthen, only begin if pain is diminshed.    Required Braces or Orthoses Other Brace/Splint    Other Brace/Splint Continue to wear thumb spica splint at night.      Strength   Overall Strength Comments Hand strength assessed for the first time this date.    Strength Assessment Site Hand    Right/Left hand Right;Left    Right Hand Grip (lbs) 45    Right Hand Lateral Pinch 5 lbs    Right Hand 3 Point Pinch 6 lbs    Left Hand Grip (lbs) 42    Left Hand Lateral Pinch 5 lbs    Left Hand 3 Point Pinch 4 lbs                      OT Treatments/Exercises (OP) - 05/19/21 1459       Exercises   Exercises Hand;Theraputty      Hand Exercises   Other Hand Exercises Wringing out rolled red putty mimicing a washcloth.  Theraputty   Theraputty - Roll red    Theraputty - Grip red - pronated and supinated    Theraputty Hand- Locate Pegs yellow - 6/6 beads located                    OT Education - 05/19/21 1647     Education Details Reviewed updated protocol. Progress as tolerated. Expectations for recovery. Provided red putty for grip strengthening. Continue to use yellow for pinch strength. Wrist radial deviation strengthening with 1#    Person(s) Educated Patient    Methods Explanation;Demonstration;Handout    Comprehension Returned demonstration;Verbalized understanding              OT Short Term Goals - 05/05/21 1600       OT SHORT TERM GOAL #1   Title Patient will be educated and independent with HEP in order to increase her functional use of her left hand and return to using it as her non-dominant extremity for 100% of required tasks.    Time 6    Period Weeks    Status On-going    Target Date 05/28/21      OT SHORT TERM GOAL #2   Title Patient will increase her Left wrist A/ROM  to WNL where lacking in order to increase her ability to complete tasks such as turning a door knob or pouring liquid from a container into a glass.    Time 6    Period Weeks    Status On-going      OT SHORT TERM GOAL #3   Title Patient will demonstrate functional grip and pinch strength that is 75% or more of the average norms for her age in order to be able to manipulate items and maintain a grasp without dropping.    Time 6    Period Weeks    Status On-going      OT SHORT TERM GOAL #4   Title Patient will decrease fascial restrictions in the left wrist and forearm to trace amount in order to increase her functional mobility that is required for daily tasks such as brushing her hair or her teeth.    Time 6    Period Weeks    Status On-going      OT SHORT TERM GOAL #5   Title Pt will report an average pain score of approximately 2/10 on a daily basis when completing basic self care tasks such as bathing, dressing, and caring for her dog.    Time 6    Period Weeks    Status On-going                      Plan - 05/20/21 1324     Clinical Impression Statement A: Able to assess hand strength this date per protocol. Pinch strength presents with more weakness compared to grip strength. Updated HEP to include hand strengthening using yellow putty for pinch and red putty for grip. Pt concerned about alignment of wrist as it remains at ulnar deviation at rest. VC for form and technique provided during session. Recommend 1 more treatment session to work on wrist and hand strength before planned foot surgery on 05/29/21.    Plan P: follow up on HEP. Resistive clothespin task. Complete reassessment. We can either place a hold on OT or discharge for her foot surgery then get a new referral if she feels she needs to continue...see what she'd like to do.    OT Home Exercise Plan  eval: wrist A/ROM, ice massage, scar massage. 2/14: yellow putty 2/28: red putty for grip strength. Start yellow  putty for pinch strength. Wrist radial    Consulted and Agree with Plan of Care Patient             Patient will benefit from skilled therapeutic intervention in order to improve the following deficits and impairments:           Visit Diagnosis: Other symptoms and signs involving the musculoskeletal system  Pain in left finger(s)  Stiffness of left wrist, not elsewhere classified  Pain in left wrist    Problem List Patient Active Problem List   Diagnosis Date Noted   Lumbar back pain with radiculopathy affecting left lower extremity 05/02/2020   Disorder of breast 04/17/2019   Malaise and fatigue 04/17/2019   Mood changes 04/17/2019   Rheumatoid arthritis ([place]) 04/17/2019   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

## 2021-05-21 ENCOUNTER — Encounter: Payer: Medicare HMO | Admitting: Podiatry

## 2021-05-26 ENCOUNTER — Ambulatory Visit (HOSPITAL_COMMUNITY): Payer: Medicare HMO

## 2021-05-28 ENCOUNTER — Telehealth: Payer: Self-pay

## 2021-05-28 ENCOUNTER — Other Ambulatory Visit: Payer: Self-pay | Admitting: Podiatry

## 2021-05-28 DIAGNOSIS — G5761 Lesion of plantar nerve, right lower limb: Secondary | ICD-10-CM | POA: Diagnosis not present

## 2021-05-28 MED ORDER — HYDROCODONE-ACETAMINOPHEN 10-325 MG PO TABS: 1.0000 | ORAL_TABLET | Freq: Four times a day (QID) | ORAL | 0 refills | Status: AC | PRN

## 2021-05-28 MED ORDER — ONDANSETRON HCL 4 MG PO TABS: 4.0000 mg | ORAL_TABLET | Freq: Three times a day (TID) | ORAL | 0 refills | Status: DC | PRN

## 2021-05-28 MED ORDER — CEPHALEXIN 500 MG PO CAPS: 500.0000 mg | ORAL_CAPSULE | Freq: Three times a day (TID) | ORAL | 0 refills | Status: DC

## 2021-05-28 NOTE — Telephone Encounter (Signed)
Spoke to [place]. She didn't pick up the medications that were called in for the canceled surgery in Jan. I told her that she will need to pick them up today or after surgery tomorrow. She stated she is does she a pain management doctor and he has her on Vicodine. I told her we needed to get the information to her pain management doctor so we can know the terms of their agreement. She started screaming at me that it was my responsibility to get this information, not hers. I just needed to know who her doctor is so I could request the information. She would not stop screaming at me about the medication and about the surgery being canceled previously.  I told her I was ending the phone call if she didn't stop screaming at me.  She ended up hanging up on me. ? ?===View-only below this line=== ?----- Message ----- ?From: Garrel Ridgel, DPM ?Sent: 05/28/2021   7:39 AM EST ?To: Jonnie Finner ? ?Shelly could you check with her to see if she received her medications the last time she was scheduled for surgery?  Does she still have them?  Also if she being treated by pain management who provided her with narcotics?  If so I will not prescribe her more pain medication so I will avoid her contract with pain management. ? ?If she does not have any of this medication I will prescribe it by the end of the day.  Thank you so much. ?

## 2021-05-29 NOTE — Telephone Encounter (Signed)
Please advise 

## 2021-05-29 NOTE — Telephone Encounter (Signed)
Patient lvm stating she did make her sx this morning. She just wanted the office to know. ?

## 2021-05-29 NOTE — Telephone Encounter (Signed)
Patient called and stated she did not make her sx this morning.  ?

## 2021-06-03 ENCOUNTER — Encounter (HOSPITAL_COMMUNITY): Payer: Medicare HMO

## 2021-06-03 DIAGNOSIS — M79671 Pain in right foot: Secondary | ICD-10-CM | POA: Diagnosis not present

## 2021-06-03 DIAGNOSIS — Z79891 Long term (current) use of opiate analgesic: Secondary | ICD-10-CM | POA: Diagnosis not present

## 2021-06-03 DIAGNOSIS — M059 Rheumatoid arthritis with rheumatoid factor, unspecified: Secondary | ICD-10-CM | POA: Diagnosis not present

## 2021-06-03 DIAGNOSIS — I7381 Erythromelalgia: Secondary | ICD-10-CM | POA: Diagnosis not present

## 2021-06-03 DIAGNOSIS — G894 Chronic pain syndrome: Secondary | ICD-10-CM | POA: Diagnosis not present

## 2021-06-04 ENCOUNTER — Encounter: Payer: Medicare HMO | Admitting: Podiatry

## 2021-06-09 ENCOUNTER — Encounter: Payer: Self-pay | Admitting: Podiatry

## 2021-06-09 NOTE — Telephone Encounter (Signed)
Letter was sent to the patient. Thanks, Quinci Gavidia  ?

## 2021-06-10 ENCOUNTER — Other Ambulatory Visit: Payer: Self-pay

## 2021-06-10 ENCOUNTER — Ambulatory Visit (HOSPITAL_COMMUNITY): Payer: Medicare HMO | Attending: Orthopedic Surgery

## 2021-06-10 ENCOUNTER — Encounter (HOSPITAL_COMMUNITY): Payer: Self-pay

## 2021-06-10 ENCOUNTER — Telehealth (HOSPITAL_COMMUNITY): Payer: Self-pay

## 2021-06-10 DIAGNOSIS — R29898 Other symptoms and signs involving the musculoskeletal system: Secondary | ICD-10-CM | POA: Insufficient documentation

## 2021-06-10 DIAGNOSIS — M79672 Pain in left foot: Secondary | ICD-10-CM | POA: Diagnosis not present

## 2021-06-10 DIAGNOSIS — M25532 Pain in left wrist: Secondary | ICD-10-CM | POA: Diagnosis not present

## 2021-06-10 DIAGNOSIS — M25632 Stiffness of left wrist, not elsewhere classified: Secondary | ICD-10-CM | POA: Insufficient documentation

## 2021-06-10 DIAGNOSIS — M79645 Pain in left finger(s): Secondary | ICD-10-CM | POA: Insufficient documentation

## 2021-06-10 DIAGNOSIS — F17200 Nicotine dependence, unspecified, uncomplicated: Secondary | ICD-10-CM | POA: Diagnosis not present

## 2021-06-10 DIAGNOSIS — M79671 Pain in right foot: Secondary | ICD-10-CM | POA: Diagnosis not present

## 2021-06-10 NOTE — Therapy (Addendum)
?OUTPATIENT OCCUPATIONAL THERAPY TREATMENT NOTE - reassessment ? ? ?Patient Name: Krista Harris ?MRN: [id number] ?DOB:12-29-1955, 66 y.o., female ?Today's Date: 06/10/2021 ? ?PCP: Celene Squibb, MD ?REFERRING PROVIDER: [name], MD ? ?Progress Note ?Reporting Period 05/19/21 to 06/10/21 ? ?See note below for Objective Data and Assessment of Progress/Goals.  ? ?  ? ? OT End of Session - 06/10/21 1404   ? ? Visit Number 4   ? Number of Visits 7   ? Date for OT Re-Evaluation 07/08/21   ? Authorization Type [employer]   ? Authorization Time Period $20 copay Authorized 7 visits (04/20/21-05/21/21) *Requesting 4 more visits on 3/22.   ? Authorization - Visit Number 2   ? Authorization - Number of Visits 7   ? Progress Note Due on Visit 14   ? OT Start Time 1345   reassessment  ? OT Stop Time 1423   ? OT Time Calculation (min) 38 min   ? Activity Tolerance Patient tolerated treatment well   ? Behavior During Therapy [facility] for tasks assessed/performed   ? ?  ?  ? ?  ? ? ?Past Medical History:  ?Diagnosis Date  ? BMS (burning mouth syndrome)   ? Erythromelalgia ([place])   ? Fibromyalgia   ? Rheumatoid arthritis ([place])   ? ?Past Surgical History:  ?Procedure Laterality Date  ? AUGMENTATION MAMMAPLASTY    ? breast implants now removed  ? CARPOMETACARPEL SUSPENSION PLASTY Left 03/05/2021  ? Procedure: LEFT THUMB TRAPEZIECTOMY AND SUSPENSION PLASTY;  Surgeon: [name], MD;  Location: [facility];  Service: Orthopedics;  Laterality: Left;  Regional block  ? FOOT SURGERY Bilateral   ? for rheumatoid arthritis  ? HERNIA REPAIR    ? WRIST SURGERY    ? ?Patient Active Problem List  ? Diagnosis Date Noted  ? Lumbar back pain with radiculopathy affecting left lower extremity 05/02/2020  ? Disorder of breast 04/17/2019  ? Malaise and fatigue 04/17/2019  ? Mood changes 04/17/2019  ? Rheumatoid arthritis ([place]) 04/17/2019  ? 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 Dry mouth 11/10/2017  ? Irritable bowel syndrome 11/04/2017  ?  Primary fibromyalgia syndrome 11/04/2017  ? Diarrhea 04/05/2017  ? History of colonic polyps 04/05/2017  ? 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 Social phobia 04/08/2014  ? ? ?ONSET DATE: 03/05/21 ? ?REFERRING DIAG: S/P left hand CMC joint trapeziectomy and suspensioplasty of thumb  ? ?THERAPY DIAG:  ?Other symptoms and signs involving the musculoskeletal system - Plan: Ot plan of care cert/re-cert ? ?Pain in left finger(s) - Plan: Ot plan of care cert/re-cert ? ?Stiffness of left wrist, not elsewhere classified - Plan: Ot plan of care cert/re-cert ? ?Pain in left wrist - Plan: Ot plan of care cert/re-cert ? ? ?PERTINENT HISTORY: Patient is a 66 y/o female S/P left thumb CMC joint trapeziectomy and suspencioplasty which was performed on 03/05/21.  ? ?PRECAUTIONS: Progress as tolerated. ? ?SUBJECTIVE: S: I woke up this morning and both wrist were flexed and I don't like that. I need to start wearing my braces at night.  ? ?PAIN:  ?Are you having pain? no pain. numbness and tingling reported in left thumb DIP and close to healed incision. ? ? ? ? ?OBJECTIVE:  ? ?Assessed with arm extended for wrist flexion. Forearm on table for wrist extension. ?Wrist A/ROM - Left Evaluation 06/10/21  ?Extension 64 68  ?Flexion 20 60  ?Ulnar deviation 10 16  ?  Radial deviation 30 50  ?Grip strength 42 42  ?3 point pinch 4 6  ?Lateral pinch 5 5  ? ?TODAY'S TREATMENT:  ?06/10/21 ?-Putty: yellow, roll, pinch (3 point and lateral), grip ? ? ?PATIENT EDUCATION: ?Education details: Continue with use of putty. Yellow for pinch strength, red for grip strength ?Person educated: Patient ?Education method: Explanation ?Education comprehension: verbalized understanding ? ? ?[place] ?Eval: A/ROM wrist 2/14: putty for hand strengthening yellow-pinch, red - grip 2/28: radial deviation strengthening with 1#.  ? ? OT Short Term Goals - 06/10/21 1354    ? ?  ? OT SHORT TERM GOAL #1  ? Title Patient will be educated and independent with HEP in order to increase her functional use of her left hand and return to using it as her non-dominant extremity for 100% of required tasks.   ? Time 6   ? Period Weeks   ? Status Achieved   ? Target Date 05/28/21   ?  ? OT SHORT TERM GOAL #2  ? Title Patient will increase her Left wrist A/ROM to WNL where lacking in order to increase her ability to complete tasks such as turning a door knob or pouring liquid from a container into a glass.   ? Time 6   ? Period Weeks   ? Status On-going   ?  ? OT SHORT TERM GOAL #3  ? Title Patient will demonstrate functional grip and pinch strength that is 75% or more of the average norms for her age in order to be able to manipulate items and maintain a grasp without dropping.   ? Time 6   ? Period Weeks   ? Status On-going   ?  ? OT SHORT TERM GOAL #4  ? Title Patient will decrease fascial restrictions in the left wrist and forearm to trace amount in order to increase her functional mobility that is required for daily tasks such as brushing her hair or her teeth.   ? Time 6   ? Period Weeks   ? Status Achieved   ?  ? OT SHORT TERM GOAL #5  ? Title Pt will report an average pain score of approximately 2/10 on a daily basis when completing basic self care tasks such as bathing, dressing, and caring for her dog.   ? Time 6   ? Period Weeks   ? Status Achieved   ? ?  ?  ? ?  ? ? ? ? ? Plan - 06/10/21 1420   ? ? Clinical Impression Statement A: reassessment completed this date. Patient has met 3/5 therapy goals at this time. She is able to demonstrate wrist A/ROM [facility]. Grip strength and 3 point pinch has not improved although lateral pinch strength has increased. She continues to have deficits related to hand strength, pain, and fascial restrictions and it was recommended that she continue skilled OT services once a week for 4 more weeks. HEP was reviewed.  ? Body Structure / Function / Physical  Skills ADL;UE functional use;Fascial restriction;Pain;ROM;Strength;Edema;Mobility;Dexterity;Decreased knowledge of precautions   ? OT Frequency 1x / week   ? OT Duration 4 weeks   ? Plan P: grip and pinch strength. handgripper  ? Consulted and Agree with Plan of Care Patient   ? ?  ?  ? ?  ? ? ? ?Ailene Ravel, OTR/L,CBIS  ?[phone] ? ?06/10/2021, 4:25 PM ? ?  ? ?  ?

## 2021-06-10 NOTE — Telephone Encounter (Signed)
Schedule per LE and sent patient a note in [facility] to call us back to make more apptments and to confirm this date and time. (06/18/21 '@1pm'$ ). ?

## 2021-06-11 ENCOUNTER — Encounter: Payer: Medicare HMO | Admitting: Podiatry

## 2021-06-18 ENCOUNTER — Ambulatory Visit (HOSPITAL_COMMUNITY): Payer: Medicare HMO

## 2021-06-20 DIAGNOSIS — M79671 Pain in right foot: Secondary | ICD-10-CM | POA: Diagnosis not present

## 2021-06-20 DIAGNOSIS — M79672 Pain in left foot: Secondary | ICD-10-CM | POA: Diagnosis not present

## 2021-06-25 ENCOUNTER — Encounter: Payer: Medicare HMO | Admitting: Podiatry

## 2021-06-29 DIAGNOSIS — M0569 Rheumatoid arthritis of multiple sites with involvement of other organs and systems: Secondary | ICD-10-CM | POA: Diagnosis not present

## 2021-06-29 DIAGNOSIS — M7741 Metatarsalgia, right foot: Secondary | ICD-10-CM | POA: Diagnosis not present

## 2021-06-29 DIAGNOSIS — M06371 Rheumatoid nodule, right ankle and foot: Secondary | ICD-10-CM | POA: Diagnosis not present

## 2021-06-29 DIAGNOSIS — F17200 Nicotine dependence, unspecified, uncomplicated: Secondary | ICD-10-CM | POA: Diagnosis not present

## 2021-06-29 DIAGNOSIS — M7742 Metatarsalgia, left foot: Secondary | ICD-10-CM | POA: Diagnosis not present

## 2021-07-09 ENCOUNTER — Encounter: Payer: Medicare HMO | Admitting: Podiatry

## 2021-07-09 DIAGNOSIS — M79671 Pain in right foot: Secondary | ICD-10-CM | POA: Diagnosis not present

## 2021-07-09 DIAGNOSIS — M79672 Pain in left foot: Secondary | ICD-10-CM | POA: Diagnosis not present

## 2021-07-09 DIAGNOSIS — M810 Age-related osteoporosis without current pathological fracture: Secondary | ICD-10-CM | POA: Diagnosis not present

## 2021-07-09 DIAGNOSIS — L989 Disorder of the skin and subcutaneous tissue, unspecified: Secondary | ICD-10-CM | POA: Diagnosis not present

## 2021-07-21 DIAGNOSIS — I7381 Erythromelalgia: Secondary | ICD-10-CM | POA: Diagnosis not present

## 2021-07-21 DIAGNOSIS — G894 Chronic pain syndrome: Secondary | ICD-10-CM | POA: Diagnosis not present

## 2021-07-21 DIAGNOSIS — M79671 Pain in right foot: Secondary | ICD-10-CM | POA: Diagnosis not present

## 2021-07-21 DIAGNOSIS — M059 Rheumatoid arthritis with rheumatoid factor, unspecified: Secondary | ICD-10-CM | POA: Diagnosis not present

## 2021-07-21 DIAGNOSIS — Z79891 Long term (current) use of opiate analgesic: Secondary | ICD-10-CM | POA: Diagnosis not present

## 2021-09-03 ENCOUNTER — Telehealth: Payer: Self-pay | Admitting: *Deleted

## 2021-09-03 DIAGNOSIS — Z79899 Other long term (current) drug therapy: Secondary | ICD-10-CM | POA: Diagnosis not present

## 2021-09-03 DIAGNOSIS — M0579 Rheumatoid arthritis with rheumatoid factor of multiple sites without organ or systems involvement: Secondary | ICD-10-CM | POA: Diagnosis not present

## 2021-09-03 DIAGNOSIS — M810 Age-related osteoporosis without current pathological fracture: Secondary | ICD-10-CM | POA: Diagnosis not present

## 2021-09-09 DIAGNOSIS — H2513 Age-related nuclear cataract, bilateral: Secondary | ICD-10-CM | POA: Diagnosis not present

## 2021-09-14 DIAGNOSIS — Z1283 Encounter for screening for malignant neoplasm of skin: Secondary | ICD-10-CM | POA: Diagnosis not present

## 2021-09-14 DIAGNOSIS — L570 Actinic keratosis: Secondary | ICD-10-CM | POA: Diagnosis not present

## 2021-09-14 DIAGNOSIS — C4441 Basal cell carcinoma of skin of scalp and neck: Secondary | ICD-10-CM | POA: Diagnosis not present

## 2021-09-14 DIAGNOSIS — X32XXXA Exposure to sunlight, initial encounter: Secondary | ICD-10-CM | POA: Diagnosis not present

## 2021-09-14 DIAGNOSIS — D225 Melanocytic nevi of trunk: Secondary | ICD-10-CM | POA: Diagnosis not present

## 2021-09-16 IMAGING — MR MR LUMBAR SPINE W/O CM
5 series · 31 of 48 positions shown · non-contrast
Comparison: None.

CLINICAL DATA: Left thigh pain with history of rheumatoid
arthritis.

EXAM:
MRI LUMBAR SPINE WITHOUT CONTRAST
TECHNIQUE: Multiplanar, multisequence MR imaging of the lumbar spine was
performed. No intravenous contrast was administered.

[Series 5: T2 · sagittal · 4.0mm · 0.68mm/px · 6 of 13 slices shown (1 of 2)]
[im 1/13]
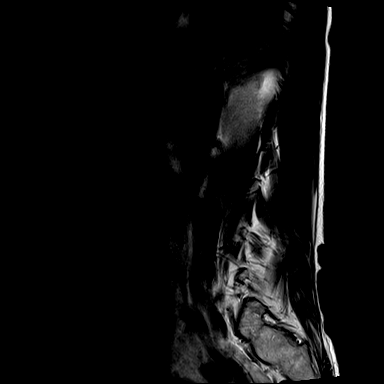
[im 3/13]
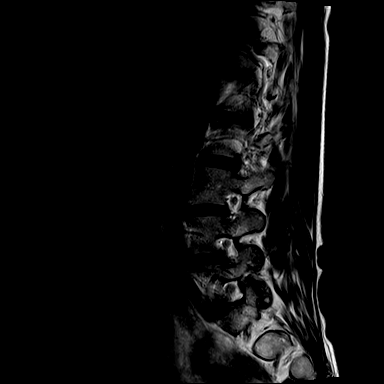
[im 5/13]
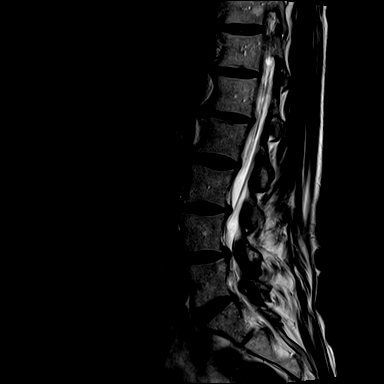
[im 8/13]
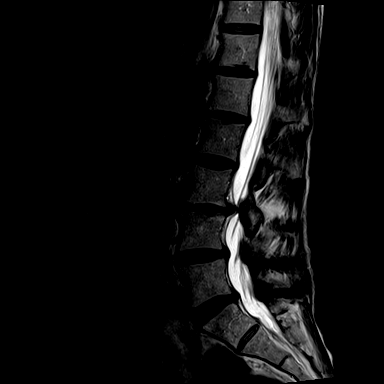
[im 10/13]
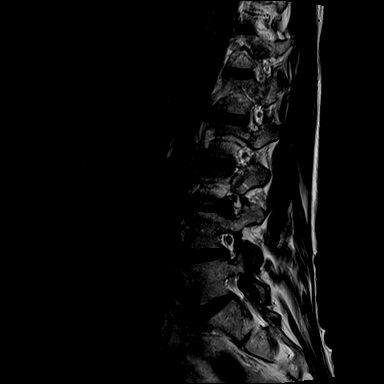
[im 13/13]
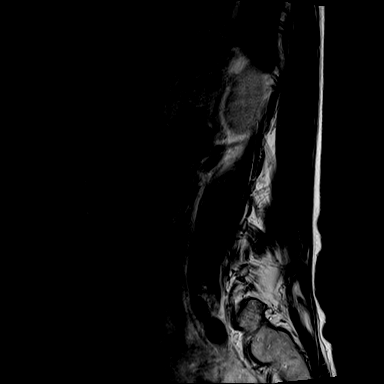

[Series 6: T1 · sagittal · 4.0mm · 0.81mm/px · 6 of 13 slices shown (1 of 2)]
[im 1/13]
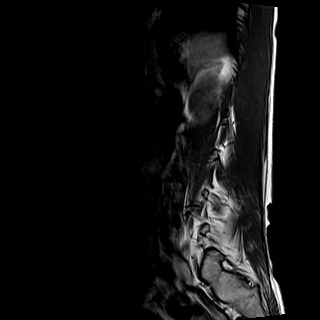
[im 3/13]
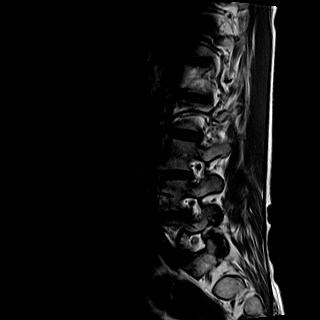
[im 5/13]
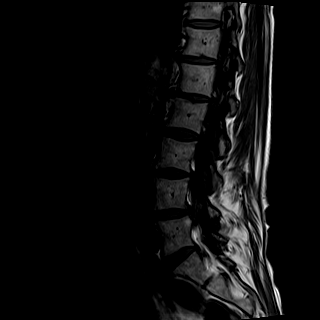
[im 8/13]
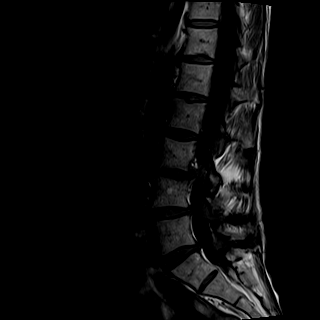
[im 10/13]
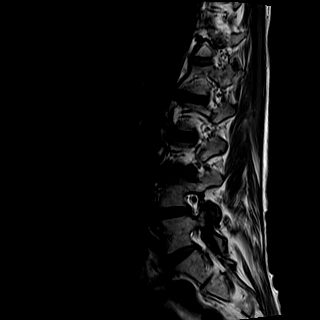
[im 13/13]
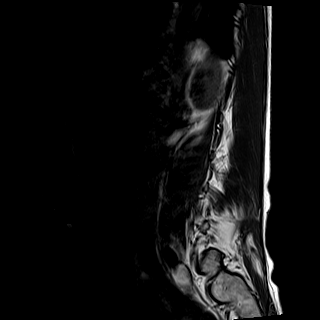

[Series 7: STIR · sagittal · 4.0mm · 0.51mm/px · 1 of 13 slices shown]
[im 1/13]
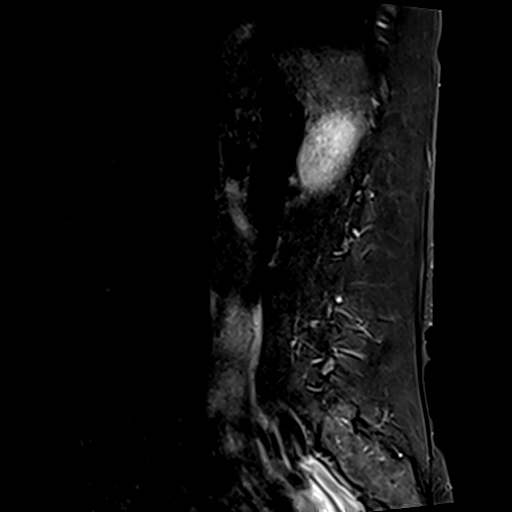

[Series 8: T2 · axial · 4.0mm · 0.70mm/px · z∈[-149,+40]mm · 9 of 32 slices shown (2 of 2)]
[im 1/32]
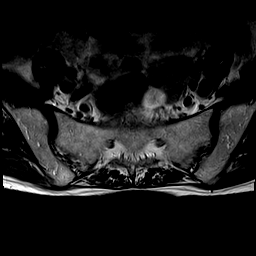
[im 5/32]
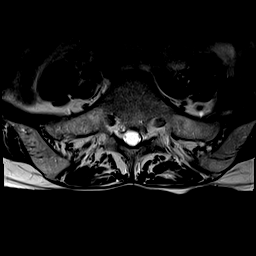
[im 9/32]
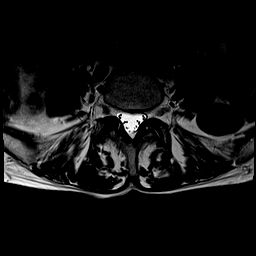
[im 14/32]
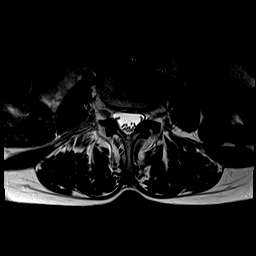
[im 16/32]
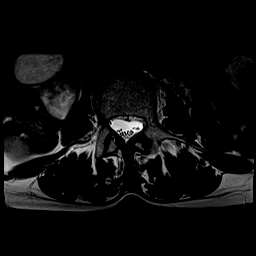
[im 18/32]
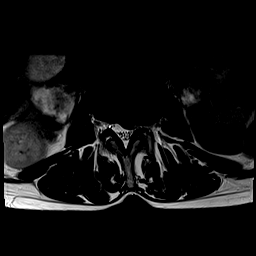
[im 23/32]
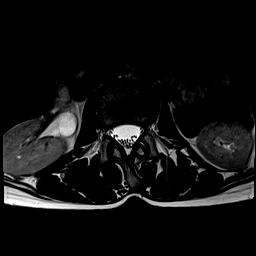
[im 27/32]
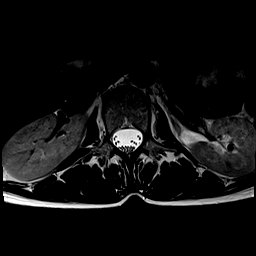
[im 32/32]
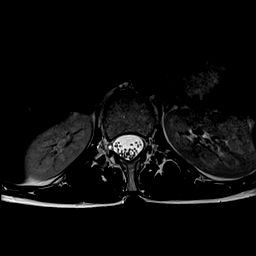

[Series 9: T1 · axial · 4.0mm · 0.35mm/px · z∈[-149,+40]mm · 9 of 32 slices shown (2 of 2)]
[im 1/32]
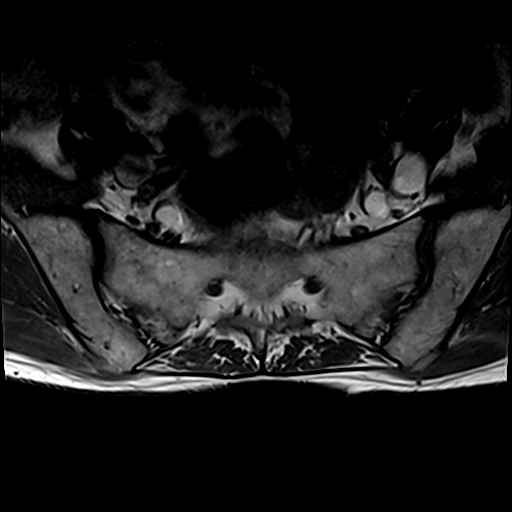
[im 5/32]
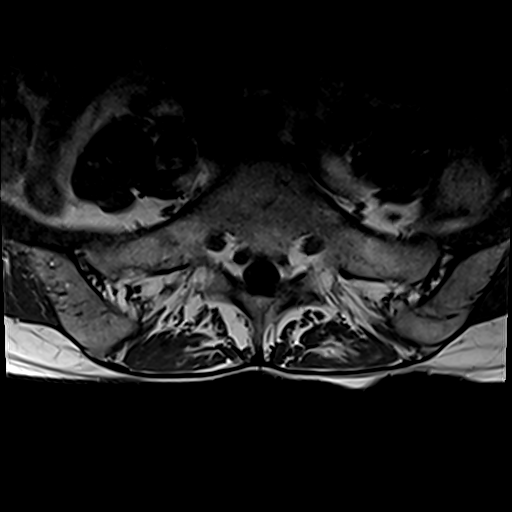
[im 9/32]
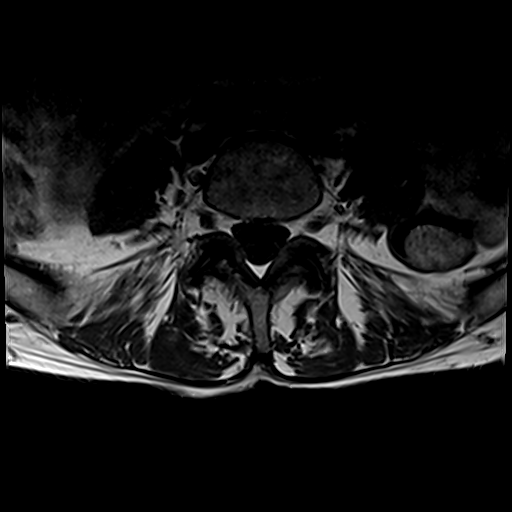
[im 14/32]
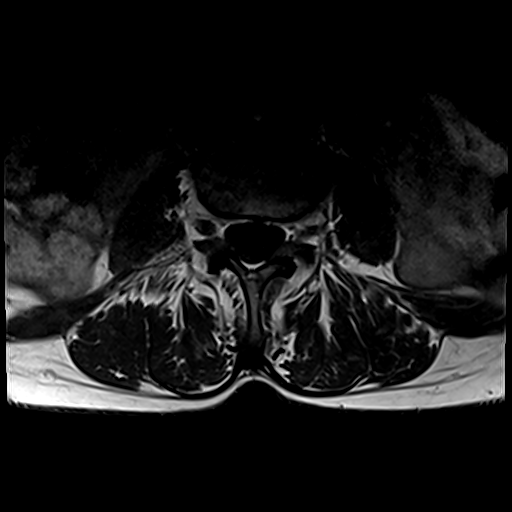
[im 16/32]
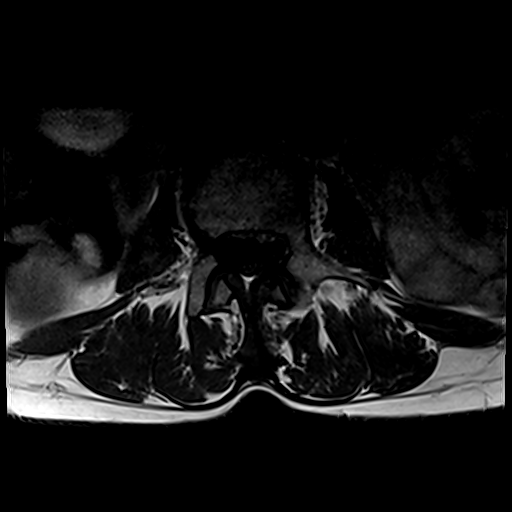
[im 18/32]
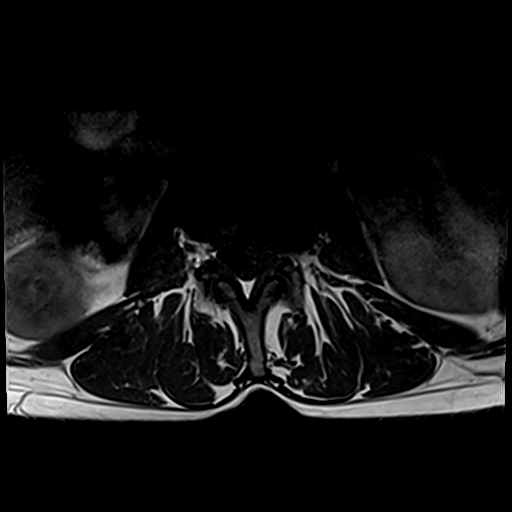
[im 23/32]
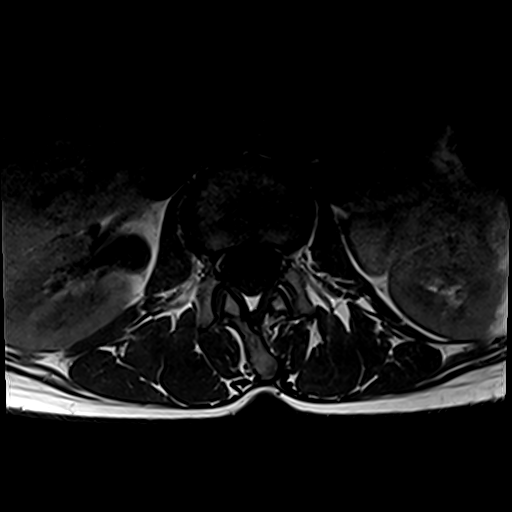
[im 27/32]
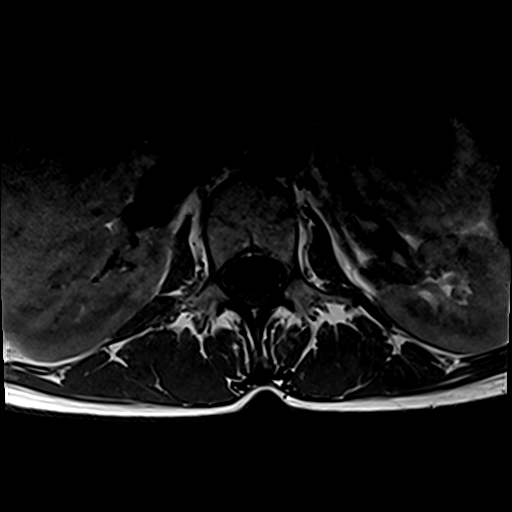
[im 32/32]
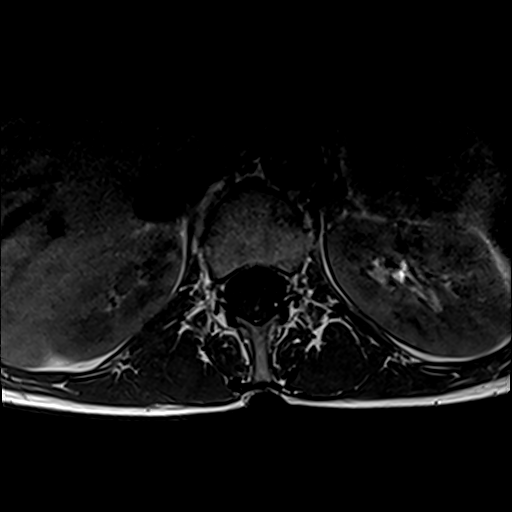

[31 of 48 positions shown; findings below may reference images not displayed]

FINDINGS: Segmentation:  Standard

Alignment:  Normal

Vertebrae:  No fracture, evidence of discitis, or bone lesion.

Conus medullaris and cauda equina: Conus extends to the L1 level.
Conus and cauda equina appear normal.

Paraspinal and other soft tissues: Negative

Disc levels:

L1-2: Small disc bulge without spinal canal or neural foraminal
stenosis.

L2-3: Normal.

L3-4: Large left subarticular disc protrusion narrowing the left
lateral recess and displacing the descending left L4 nerve root. No
central spinal canal stenosis. Moderate left foraminal stenosis.

L4-5: Small disc bulge mild facet hypertrophy. No spinal canal or
neural foraminal stenosis.

L5-S1: Normal disc.  No stenosis.
IMPRESSION: 1. Large left subarticular disc protrusion at L3-L4 narrowing the
left lateral recess and displacing the descending left L4 nerve
root. Moderate left L3-4 neural foraminal stenosis.
2. No other spinal canal or neural foraminal stenosis.

## 2021-10-12 DIAGNOSIS — M059 Rheumatoid arthritis with rheumatoid factor, unspecified: Secondary | ICD-10-CM | POA: Diagnosis not present

## 2021-10-12 DIAGNOSIS — G894 Chronic pain syndrome: Secondary | ICD-10-CM | POA: Diagnosis not present

## 2021-10-12 DIAGNOSIS — Z79891 Long term (current) use of opiate analgesic: Secondary | ICD-10-CM | POA: Diagnosis not present

## 2021-10-12 DIAGNOSIS — M797 Fibromyalgia: Secondary | ICD-10-CM | POA: Diagnosis not present

## 2021-10-12 DIAGNOSIS — I7381 Erythromelalgia: Secondary | ICD-10-CM | POA: Diagnosis not present

## 2021-10-21 DIAGNOSIS — E785 Hyperlipidemia, unspecified: Secondary | ICD-10-CM | POA: Diagnosis not present

## 2021-10-21 DIAGNOSIS — R7301 Impaired fasting glucose: Secondary | ICD-10-CM | POA: Diagnosis not present

## 2021-10-26 DIAGNOSIS — E875 Hyperkalemia: Secondary | ICD-10-CM | POA: Diagnosis not present

## 2021-10-26 DIAGNOSIS — Z681 Body mass index (BMI) 19 or less, adult: Secondary | ICD-10-CM | POA: Diagnosis not present

## 2021-10-26 DIAGNOSIS — Z5320 Procedure and treatment not carried out because of patient's decision for unspecified reasons: Secondary | ICD-10-CM | POA: Diagnosis not present

## 2021-10-26 DIAGNOSIS — K429 Umbilical hernia without obstruction or gangrene: Secondary | ICD-10-CM | POA: Diagnosis not present

## 2021-10-26 DIAGNOSIS — E785 Hyperlipidemia, unspecified: Secondary | ICD-10-CM | POA: Diagnosis not present

## 2021-10-26 DIAGNOSIS — D7589 Other specified diseases of blood and blood-forming organs: Secondary | ICD-10-CM | POA: Diagnosis not present

## 2021-10-26 DIAGNOSIS — G894 Chronic pain syndrome: Secondary | ICD-10-CM | POA: Diagnosis not present

## 2021-10-26 DIAGNOSIS — M797 Fibromyalgia: Secondary | ICD-10-CM | POA: Diagnosis not present

## 2021-11-16 ENCOUNTER — Emergency Department (HOSPITAL_COMMUNITY)
Admission: EM | Admit: 2021-11-16 | Discharge: 2021-11-16 | Discharge status: Against Medical Advice | Payer: Medicare HMO | Attending: Emergency Medicine | Admitting: Emergency Medicine

## 2021-11-16 ENCOUNTER — Encounter (HOSPITAL_COMMUNITY): Payer: Self-pay

## 2021-11-16 ENCOUNTER — Emergency Department (HOSPITAL_COMMUNITY)
Admission: EM | Admit: 2021-11-16 | Discharge: 2021-11-16 | Discharge status: Against Medical Advice | Payer: Medicare HMO | Source: Home / Self Care

## 2021-11-16 ENCOUNTER — Other Ambulatory Visit: Payer: Self-pay

## 2021-11-16 DIAGNOSIS — Z5321 Procedure and treatment not carried out due to patient leaving prior to being seen by health care provider: Secondary | ICD-10-CM | POA: Insufficient documentation

## 2021-11-16 DIAGNOSIS — L02413 Cutaneous abscess of right upper limb: Secondary | ICD-10-CM | POA: Insufficient documentation

## 2021-11-16 NOTE — ED Triage Notes (Signed)
Abscess to right forearm. Thinks that there are worms coming out of it. Has been there appx 2 weeks but thinks the worms are recent. Supposed to see pcp tomorrow.  Area is red and swollen said she "went at this this morning and dug and dug"

## 2021-11-16 NOTE — ED Notes (Signed)
Pt seen walking down hall. This RN asked pt where she was going, pt responded "I am going home". Pt seen leaving building.

## 2021-11-16 NOTE — ED Triage Notes (Signed)
Pt returned to ED- stated she had to take her dog home.  Abscess to right forearm. Thinks that there are worms coming out of it. Has been there appx 2 weeks but thinks the worms are recent. Supposed to see pcp tomorrow.  Area is red and swollen said she "went at this this morning and dug and dug"

## 2021-11-16 NOTE — ED Notes (Signed)
Pt stated she is leaving. Krista Harris that she is upset that no one has seen her . Advised that she was informed that it might be when the 11pm md arrives before she is seen. Stated she is going to go home ad live with her worms.

## 2021-11-17 DIAGNOSIS — L03113 Cellulitis of right upper limb: Secondary | ICD-10-CM | POA: Diagnosis not present

## 2021-11-17 DIAGNOSIS — S51831A Puncture wound without foreign body of right forearm, initial encounter: Secondary | ICD-10-CM | POA: Diagnosis not present

## 2021-11-19 DIAGNOSIS — M79671 Pain in right foot: Secondary | ICD-10-CM | POA: Diagnosis not present

## 2021-11-19 DIAGNOSIS — R2241 Localized swelling, mass and lump, right lower limb: Secondary | ICD-10-CM | POA: Diagnosis not present

## 2021-12-08 DIAGNOSIS — M79671 Pain in right foot: Secondary | ICD-10-CM | POA: Diagnosis not present

## 2021-12-08 DIAGNOSIS — R2241 Localized swelling, mass and lump, right lower limb: Secondary | ICD-10-CM | POA: Diagnosis not present

## 2021-12-08 DIAGNOSIS — M069 Rheumatoid arthritis, unspecified: Secondary | ICD-10-CM | POA: Diagnosis not present

## 2021-12-24 DIAGNOSIS — Z79899 Other long term (current) drug therapy: Secondary | ICD-10-CM | POA: Diagnosis not present

## 2021-12-24 DIAGNOSIS — M0579 Rheumatoid arthritis with rheumatoid factor of multiple sites without organ or systems involvement: Secondary | ICD-10-CM | POA: Diagnosis not present

## 2021-12-24 DIAGNOSIS — M810 Age-related osteoporosis without current pathological fracture: Secondary | ICD-10-CM | POA: Diagnosis not present

## 2021-12-25 NOTE — Telephone Encounter (Signed)
Error message

## 2021-12-26 DIAGNOSIS — H524 Presbyopia: Secondary | ICD-10-CM | POA: Diagnosis not present

## 2022-01-09 DIAGNOSIS — K047 Periapical abscess without sinus: Secondary | ICD-10-CM | POA: Diagnosis not present

## 2022-01-11 DIAGNOSIS — M810 Age-related osteoporosis without current pathological fracture: Secondary | ICD-10-CM | POA: Diagnosis not present

## 2022-01-21 DIAGNOSIS — Z79899 Other long term (current) drug therapy: Secondary | ICD-10-CM | POA: Diagnosis not present

## 2022-01-21 DIAGNOSIS — F1190 Opioid use, unspecified, uncomplicated: Secondary | ICD-10-CM | POA: Diagnosis not present

## 2022-01-21 DIAGNOSIS — M79672 Pain in left foot: Secondary | ICD-10-CM | POA: Diagnosis not present

## 2022-01-27 DIAGNOSIS — I7381 Erythromelalgia: Secondary | ICD-10-CM | POA: Diagnosis not present

## 2022-01-27 DIAGNOSIS — G894 Chronic pain syndrome: Secondary | ICD-10-CM | POA: Diagnosis not present

## 2022-01-27 DIAGNOSIS — Z79891 Long term (current) use of opiate analgesic: Secondary | ICD-10-CM | POA: Diagnosis not present

## 2022-01-27 DIAGNOSIS — M059 Rheumatoid arthritis with rheumatoid factor, unspecified: Secondary | ICD-10-CM | POA: Diagnosis not present

## 2022-02-01 DIAGNOSIS — M25841 Other specified joint disorders, right hand: Secondary | ICD-10-CM | POA: Diagnosis not present

## 2022-02-01 DIAGNOSIS — Z7952 Long term (current) use of systemic steroids: Secondary | ICD-10-CM | POA: Diagnosis not present

## 2022-02-01 DIAGNOSIS — M0579 Rheumatoid arthritis with rheumatoid factor of multiple sites without organ or systems involvement: Secondary | ICD-10-CM | POA: Diagnosis not present

## 2022-02-01 DIAGNOSIS — Z96698 Presence of other orthopedic joint implants: Secondary | ICD-10-CM | POA: Diagnosis not present

## 2022-02-01 DIAGNOSIS — F17210 Nicotine dependence, cigarettes, uncomplicated: Secondary | ICD-10-CM | POA: Diagnosis not present

## 2022-02-01 DIAGNOSIS — M1812 Unilateral primary osteoarthritis of first carpometacarpal joint, left hand: Secondary | ICD-10-CM | POA: Diagnosis not present

## 2022-02-01 DIAGNOSIS — M19049 Primary osteoarthritis, unspecified hand: Secondary | ICD-10-CM | POA: Diagnosis not present

## 2022-02-01 DIAGNOSIS — Z79631 Long term (current) use of antimetabolite agent: Secondary | ICD-10-CM | POA: Diagnosis not present

## 2022-02-02 ENCOUNTER — Ambulatory Visit (HOSPITAL_COMMUNITY)
Admission: RE | Admit: 2022-02-02 | Discharge: 2022-02-02 | Disposition: A | Discharge status: Home / Self Care | Payer: Medicare HMO | Source: Ambulatory Visit | Attending: Family Medicine | Admitting: Family Medicine

## 2022-02-02 ENCOUNTER — Other Ambulatory Visit (HOSPITAL_COMMUNITY): Payer: Self-pay | Admitting: Family Medicine

## 2022-02-02 DIAGNOSIS — M79672 Pain in left foot: Secondary | ICD-10-CM

## 2022-02-02 DIAGNOSIS — M7989 Other specified soft tissue disorders: Secondary | ICD-10-CM | POA: Diagnosis not present

## 2022-02-10 DIAGNOSIS — R2241 Localized swelling, mass and lump, right lower limb: Secondary | ICD-10-CM | POA: Diagnosis not present

## 2022-02-10 DIAGNOSIS — L928 Other granulomatous disorders of the skin and subcutaneous tissue: Secondary | ICD-10-CM | POA: Diagnosis not present

## 2022-02-10 DIAGNOSIS — M06071 Rheumatoid arthritis without rheumatoid factor, right ankle and foot: Secondary | ICD-10-CM | POA: Diagnosis not present

## 2022-02-10 DIAGNOSIS — M069 Rheumatoid arthritis, unspecified: Secondary | ICD-10-CM | POA: Diagnosis not present

## 2022-02-16 DIAGNOSIS — F418 Other specified anxiety disorders: Secondary | ICD-10-CM | POA: Diagnosis not present

## 2022-02-23 ENCOUNTER — Encounter (HOSPITAL_COMMUNITY): Payer: Self-pay | Admitting: Family Medicine

## 2022-02-24 ENCOUNTER — Other Ambulatory Visit (HOSPITAL_COMMUNITY): Payer: Self-pay | Admitting: Internal Medicine

## 2022-02-24 DIAGNOSIS — R2242 Localized swelling, mass and lump, left lower limb: Secondary | ICD-10-CM

## 2022-03-03 DIAGNOSIS — Z4802 Encounter for removal of sutures: Secondary | ICD-10-CM | POA: Diagnosis not present

## 2022-03-09 DIAGNOSIS — M7989 Other specified soft tissue disorders: Secondary | ICD-10-CM | POA: Diagnosis not present

## 2022-03-09 DIAGNOSIS — M79672 Pain in left foot: Secondary | ICD-10-CM | POA: Diagnosis not present

## 2022-03-09 DIAGNOSIS — M7732 Calcaneal spur, left foot: Secondary | ICD-10-CM | POA: Diagnosis not present

## 2022-03-09 DIAGNOSIS — M19072 Primary osteoarthritis, left ankle and foot: Secondary | ICD-10-CM | POA: Diagnosis not present

## 2022-03-09 DIAGNOSIS — S92512A Displaced fracture of proximal phalanx of left lesser toe(s), initial encounter for closed fracture: Secondary | ICD-10-CM | POA: Diagnosis not present

## 2022-03-09 DIAGNOSIS — S92512S Displaced fracture of proximal phalanx of left lesser toe(s), sequela: Secondary | ICD-10-CM | POA: Diagnosis not present

## 2022-03-09 DIAGNOSIS — S92512D Displaced fracture of proximal phalanx of left lesser toe(s), subsequent encounter for fracture with routine healing: Secondary | ICD-10-CM | POA: Diagnosis not present

## 2022-03-16 DIAGNOSIS — L292 Pruritus vulvae: Secondary | ICD-10-CM | POA: Diagnosis not present

## 2022-03-16 DIAGNOSIS — R5383 Other fatigue: Secondary | ICD-10-CM | POA: Diagnosis not present

## 2022-03-16 DIAGNOSIS — K429 Umbilical hernia without obstruction or gangrene: Secondary | ICD-10-CM | POA: Diagnosis not present

## 2022-03-16 DIAGNOSIS — R002 Palpitations: Secondary | ICD-10-CM | POA: Diagnosis not present

## 2022-03-16 DIAGNOSIS — M797 Fibromyalgia: Secondary | ICD-10-CM | POA: Diagnosis not present

## 2022-03-16 DIAGNOSIS — L293 Anogenital pruritus, unspecified: Secondary | ICD-10-CM | POA: Diagnosis not present

## 2022-03-16 DIAGNOSIS — F418 Other specified anxiety disorders: Secondary | ICD-10-CM | POA: Diagnosis not present

## 2022-03-16 DIAGNOSIS — B351 Tinea unguium: Secondary | ICD-10-CM | POA: Diagnosis not present

## 2022-03-22 HISTORY — PX: EYE SURGERY: SHX253

## 2022-03-29 ENCOUNTER — Other Ambulatory Visit: Payer: Self-pay | Admitting: *Deleted

## 2022-03-29 DIAGNOSIS — K429 Umbilical hernia without obstruction or gangrene: Secondary | ICD-10-CM

## 2022-04-01 ENCOUNTER — Ambulatory Visit: Payer: Medicare HMO | Admitting: Surgery

## 2022-04-01 ENCOUNTER — Encounter: Payer: Self-pay | Admitting: Surgery

## 2022-04-01 VITALS — BP 112/69 | HR 81 | Temp 97.9°F | Resp 14 | Ht 64.0 in | Wt 101.0 lb

## 2022-04-01 DIAGNOSIS — K439 Ventral hernia without obstruction or gangrene: Secondary | ICD-10-CM | POA: Diagnosis not present

## 2022-04-01 NOTE — Progress Notes (Signed)
[facility] History and Physical  Reason for Referral: Umbilical hernia Referring Physician: Dr. Nevada Crane  Chief Complaint   New Patient (Initial Visit)     Krista Harris is a 67 y.o. female.  HPI: Patient presents for evaluation of abdominal wall hernia.  She first noted this area 3 to 4 years ago and has been increasing in size since that time.  She does have pain associated with this area when lifting or exerting herself.  The area will partially reduce at rest, but does not ever fully reduce.  She denies any nausea or vomiting and is moving her bowels without difficulty.  Her past medical history is significant for eyrthromelalgia, fibromyalgia, and rheumatoid arthritis not currently on steroids.  Her past surgical history significant for epigastric hernia repair in the early 2000's and C-section.  She smokes 1/4 pack of cigarettes per day and is trying to quit with Chantix.  She denies use of alcohol or illicit drugs.  Past Medical History:  Diagnosis Date   BMS (burning mouth syndrome)    Erythromelalgia ([place])    Fibromyalgia    Rheumatoid arthritis ([place])     Past Surgical History:  Procedure Laterality Date   AUGMENTATION MAMMAPLASTY     breast implants now removed   CARPOMETACARPEL SUSPENSION PLASTY Left 03/05/2021   Procedure: LEFT THUMB TRAPEZIECTOMY AND SUSPENSION PLASTY;  Surgeon: [name], MD;  Location: [facility];  Service: Orthopedics;  Laterality: Left;  Regional block   FOOT SURGERY Bilateral    for rheumatoid arthritis   HERNIA REPAIR     WRIST SURGERY      Family History  Problem Relation Age of Onset   High blood pressure Mother    High Cholesterol Mother    Congestive Heart Failure Father     Social History   Tobacco Use   Smoking status: Every Day    Packs/day: 0.50    Types: Cigarettes   Smokeless tobacco: Never  Substance Use Topics   Alcohol use: No   Drug use: No    Medications: I have reviewed the  patient's current medications. Allergies as of 04/01/2022       Reactions   Adalimumab Hives   Infliximab Hives   Milnacipran Other (See Comments)    mood changes   Other    Humera; hives; Allergy; 06/12/2010 Savella; mood changes; Unspecified; 06/12/2010   Pregabalin Rash        Medication List        Accurate as of April 01, 2022 11:43 AM. If you have any questions, ask your nurse or doctor.          STOP taking these medications    amitriptyline 25 MG tablet Commonly known as: ELAVIL Stopped by: Jimie Kuwahara A Lamar Naef, DO   cephALEXin 500 MG capsule Commonly known as: KEFLEX Stopped by: Marquies Wanat A Makaylah Oddo, DO   DULoxetine 30 MG capsule Commonly known as: CYMBALTA Stopped by: Philander Ake A Ryne Mctigue, DO   folic acid 1 MG tablet Commonly known as: FOLVITE Stopped by: Jarman Litton A Catelin Manthe, DO   ibandronate 150 MG tablet Commonly known as: BONIVA Stopped by: Khadejah Son A Ira Busbin, DO   methotrexate 50 MG/2ML injection Stopped by: Wiliam Cauthorn A Lillian Tigges, DO   ondansetron 4 MG tablet Commonly known as: Zofran Stopped by: Damauri Minion A Ling Flesch, DO   predniSONE 10 MG tablet Commonly known as: DELTASONE Stopped by: Kaeley Vinje A Shawnae Leiva, DO   triamcinolone 0.025 % cream Commonly known as: KENALOG Stopped by: Flint Melter  Karan Inclan, DO   VITAMIN C PO Stopped by: Axxel Gude A Breeona Waid, DO       TAKE these medications    aspirin 325 MG tablet Take 325 mg by mouth daily as needed for mild pain.   cholecalciferol 25 MCG (1000 UNIT) tablet Commonly known as: VITAMIN D3 Take 1,000 Units by mouth daily.   clonazePAM 1 MG tablet Commonly known as: KLONOPIN Take 1 mg by mouth daily as needed for anxiety.   gabapentin 600 MG tablet Commonly known as: NEURONTIN 1 tab po qam 1/2 tab prn afternoon   oxyCODONE-acetaminophen 7.5-325 MG tablet Commonly known as: PERCOCET Take 1 tablet by mouth.   Reclast 5 MG/100ML Soln injection Generic  drug: zoledronic acid Inject 5 mg into the vein once.   sertraline 25 MG tablet Commonly known as: ZOLOFT Take 25 mg by mouth daily.   varenicline 1 MG tablet Commonly known as: CHANTIX Take 1 mg by mouth 2 (two) times daily.         ROS:  Constitutional: negative for chills, fatigue, and fevers Eyes: negative for visual disturbance and pain Ears, nose, mouth, throat, and face: negative for ear drainage, voice change, and sinus problems Respiratory: negative for cough, wheezing, and shortness of breath Cardiovascular: negative for chest pain and palpitations Gastrointestinal: negative for abdominal pain, nausea, reflux symptoms, and vomiting Genitourinary:negative for dysuria and frequency Integument/breast: negative for dryness and rash Hematologic/lymphatic: negative for bleeding and lymphadenopathy Musculoskeletal:positive for back pain and joint pain Neurological: negative for dizziness and tremors Endocrine: negative for temperature intolerance  Blood pressure 112/69, pulse 81, temperature 97.9 F (36.6 C), temperature source Oral, resp. rate 14, height '5\' 4"'$  (1.626 m), weight 101 lb (45.8 kg), SpO2 99 %. Physical Exam Vitals reviewed.  Constitutional:      Appearance: Normal appearance.  HENT:     Head: Normocephalic and atraumatic.  Eyes:     Extraocular Movements: Extraocular movements intact.     Pupils: Pupils are equal, round, and reactive to light.  Cardiovascular:     Rate and Rhythm: Normal rate and regular rhythm.  Pulmonary:     Effort: Pulmonary effort is normal.     Breath sounds: Normal breath sounds.  Abdominal:     Comments: Abdomen soft, nondistended, no percussion tenderness, no abdominal tenderness; no rigidity, guarding or rebound tenderness; palpable mass at inferior aspect of upper midline incision, unable to reduce area, no definitive defect palpable  Musculoskeletal:        General: Normal range of motion.     Cervical back: Normal range  of motion.  Skin:    General: Skin is warm and dry.  Neurological:     General: No focal deficit present.     Mental Status: She is alert and oriented to person, place, and time.  Psychiatric:        Mood and Affect: Mood normal.        Behavior: Behavior normal.     Results: No results found for this or any previous visit (from the past 48 hour(s)).  No results found.   Assessment & Plan:  Krista Harris is a 67 y.o. female who presents for evaluation of a possible abdominal wall hernia.  -I explained the pathophysiology of hernias, and why we recommend surgical repair.  I further explained that while she does have a palpable mass which is likely hernia at her previous surgical site, I am unable to palpate a definitive defect or reduce the contents -Given that she  has no significant issues with nausea, vomiting, or bowel movements, I suspect that this is a very small hernia defect containing fat -Will obtain a CT abdomen and pelvis to fully evaluate hernia given that this is recurrent and patient is unsure as to whether mesh was used on her previous hernia repair -The risk and benefits of robotic assisted laparoscopic recurrent ventral hernia repair with mesh were discussed including but not limited to bleeding, infection, injury to surrounding structures, need for additional procedures, and hernia recurrence.   -Will await results of CT abdomen pelvis prior to scheduling any surgical interventions  All questions were answered to the satisfaction of the patient.  Graciella Freer, DO [facility] [address] [place], [place] [address] [phone] (office)

## 2022-04-02 DIAGNOSIS — M1812 Unilateral primary osteoarthritis of first carpometacarpal joint, left hand: Secondary | ICD-10-CM | POA: Diagnosis not present

## 2022-04-07 ENCOUNTER — Ambulatory Visit (HOSPITAL_BASED_OUTPATIENT_CLINIC_OR_DEPARTMENT_OTHER): Payer: Medicare HMO

## 2022-04-13 DIAGNOSIS — R2241 Localized swelling, mass and lump, right lower limb: Secondary | ICD-10-CM | POA: Diagnosis not present

## 2022-04-20 DIAGNOSIS — M797 Fibromyalgia: Secondary | ICD-10-CM | POA: Diagnosis not present

## 2022-04-20 DIAGNOSIS — G894 Chronic pain syndrome: Secondary | ICD-10-CM | POA: Diagnosis not present

## 2022-04-20 DIAGNOSIS — M059 Rheumatoid arthritis with rheumatoid factor, unspecified: Secondary | ICD-10-CM | POA: Diagnosis not present

## 2022-04-20 DIAGNOSIS — Z79891 Long term (current) use of opiate analgesic: Secondary | ICD-10-CM | POA: Diagnosis not present

## 2022-04-20 DIAGNOSIS — M25519 Pain in unspecified shoulder: Secondary | ICD-10-CM | POA: Diagnosis not present

## 2022-04-20 DIAGNOSIS — M79643 Pain in unspecified hand: Secondary | ICD-10-CM | POA: Diagnosis not present

## 2022-04-20 DIAGNOSIS — M25562 Pain in left knee: Secondary | ICD-10-CM | POA: Diagnosis not present

## 2022-04-20 DIAGNOSIS — M25561 Pain in right knee: Secondary | ICD-10-CM | POA: Diagnosis not present

## 2022-04-20 DIAGNOSIS — M79673 Pain in unspecified foot: Secondary | ICD-10-CM | POA: Diagnosis not present

## 2022-04-30 DIAGNOSIS — M0579 Rheumatoid arthritis with rheumatoid factor of multiple sites without organ or systems involvement: Secondary | ICD-10-CM | POA: Diagnosis not present

## 2022-04-30 DIAGNOSIS — Z79631 Long term (current) use of antimetabolite agent: Secondary | ICD-10-CM | POA: Diagnosis not present

## 2022-04-30 DIAGNOSIS — M810 Age-related osteoporosis without current pathological fracture: Secondary | ICD-10-CM | POA: Diagnosis not present

## 2022-04-30 DIAGNOSIS — Z7952 Long term (current) use of systemic steroids: Secondary | ICD-10-CM | POA: Diagnosis not present

## 2022-05-04 DIAGNOSIS — M25561 Pain in right knee: Secondary | ICD-10-CM | POA: Diagnosis not present

## 2022-05-04 DIAGNOSIS — M25562 Pain in left knee: Secondary | ICD-10-CM | POA: Diagnosis not present

## 2022-05-04 DIAGNOSIS — G894 Chronic pain syndrome: Secondary | ICD-10-CM | POA: Diagnosis not present

## 2022-05-04 DIAGNOSIS — I7381 Erythromelalgia: Secondary | ICD-10-CM | POA: Diagnosis not present

## 2022-05-04 DIAGNOSIS — M797 Fibromyalgia: Secondary | ICD-10-CM | POA: Diagnosis not present

## 2022-05-04 DIAGNOSIS — M059 Rheumatoid arthritis with rheumatoid factor, unspecified: Secondary | ICD-10-CM | POA: Diagnosis not present

## 2022-05-04 DIAGNOSIS — M25519 Pain in unspecified shoulder: Secondary | ICD-10-CM | POA: Diagnosis not present

## 2022-05-04 DIAGNOSIS — M79673 Pain in unspecified foot: Secondary | ICD-10-CM | POA: Diagnosis not present

## 2022-05-04 DIAGNOSIS — M79643 Pain in unspecified hand: Secondary | ICD-10-CM | POA: Diagnosis not present

## 2022-05-04 DIAGNOSIS — Z79891 Long term (current) use of opiate analgesic: Secondary | ICD-10-CM | POA: Diagnosis not present

## 2022-05-17 ENCOUNTER — Other Ambulatory Visit (HOSPITAL_COMMUNITY): Payer: Self-pay | Admitting: Family Medicine

## 2022-05-17 DIAGNOSIS — M25512 Pain in left shoulder: Secondary | ICD-10-CM | POA: Diagnosis not present

## 2022-05-17 DIAGNOSIS — Z79899 Other long term (current) drug therapy: Secondary | ICD-10-CM | POA: Diagnosis not present

## 2022-05-20 ENCOUNTER — Encounter: Payer: Self-pay | Admitting: Radiology

## 2022-05-31 DIAGNOSIS — S63501A Unspecified sprain of right wrist, initial encounter: Secondary | ICD-10-CM | POA: Diagnosis not present

## 2022-05-31 DIAGNOSIS — Z681 Body mass index (BMI) 19 or less, adult: Secondary | ICD-10-CM | POA: Diagnosis not present

## 2022-06-08 DIAGNOSIS — M25561 Pain in right knee: Secondary | ICD-10-CM | POA: Diagnosis not present

## 2022-06-08 DIAGNOSIS — M25519 Pain in unspecified shoulder: Secondary | ICD-10-CM | POA: Diagnosis not present

## 2022-06-08 DIAGNOSIS — G894 Chronic pain syndrome: Secondary | ICD-10-CM | POA: Diagnosis not present

## 2022-06-08 DIAGNOSIS — M797 Fibromyalgia: Secondary | ICD-10-CM | POA: Diagnosis not present

## 2022-06-08 DIAGNOSIS — M79673 Pain in unspecified foot: Secondary | ICD-10-CM | POA: Diagnosis not present

## 2022-06-08 DIAGNOSIS — M79643 Pain in unspecified hand: Secondary | ICD-10-CM | POA: Diagnosis not present

## 2022-06-08 DIAGNOSIS — M25562 Pain in left knee: Secondary | ICD-10-CM | POA: Diagnosis not present

## 2022-06-08 DIAGNOSIS — Z79891 Long term (current) use of opiate analgesic: Secondary | ICD-10-CM | POA: Diagnosis not present

## 2022-06-08 DIAGNOSIS — M059 Rheumatoid arthritis with rheumatoid factor, unspecified: Secondary | ICD-10-CM | POA: Diagnosis not present

## 2022-06-10 DIAGNOSIS — S93124A Dislocation of metatarsophalangeal joint of right lesser toe(s), initial encounter: Secondary | ICD-10-CM | POA: Diagnosis not present

## 2022-06-10 DIAGNOSIS — M79671 Pain in right foot: Secondary | ICD-10-CM | POA: Diagnosis not present

## 2022-06-28 DIAGNOSIS — E785 Hyperlipidemia, unspecified: Secondary | ICD-10-CM | POA: Diagnosis not present

## 2022-07-08 DIAGNOSIS — Z79891 Long term (current) use of opiate analgesic: Secondary | ICD-10-CM | POA: Diagnosis not present

## 2022-07-08 DIAGNOSIS — M461 Sacroiliitis, not elsewhere classified: Secondary | ICD-10-CM | POA: Diagnosis not present

## 2022-07-12 ENCOUNTER — Other Ambulatory Visit (HOSPITAL_COMMUNITY): Payer: Self-pay | Admitting: Family Medicine

## 2022-07-12 DIAGNOSIS — B351 Tinea unguium: Secondary | ICD-10-CM | POA: Diagnosis not present

## 2022-07-12 DIAGNOSIS — G894 Chronic pain syndrome: Secondary | ICD-10-CM | POA: Diagnosis not present

## 2022-07-12 DIAGNOSIS — R002 Palpitations: Secondary | ICD-10-CM | POA: Diagnosis not present

## 2022-07-12 DIAGNOSIS — K429 Umbilical hernia without obstruction or gangrene: Secondary | ICD-10-CM | POA: Diagnosis not present

## 2022-07-12 DIAGNOSIS — E785 Hyperlipidemia, unspecified: Secondary | ICD-10-CM | POA: Diagnosis not present

## 2022-07-12 DIAGNOSIS — M797 Fibromyalgia: Secondary | ICD-10-CM | POA: Diagnosis not present

## 2022-07-12 DIAGNOSIS — D7589 Other specified diseases of blood and blood-forming organs: Secondary | ICD-10-CM | POA: Diagnosis not present

## 2022-07-12 DIAGNOSIS — F418 Other specified anxiety disorders: Secondary | ICD-10-CM | POA: Diagnosis not present

## 2022-07-12 DIAGNOSIS — R5383 Other fatigue: Secondary | ICD-10-CM | POA: Diagnosis not present

## 2022-07-12 DIAGNOSIS — M79671 Pain in right foot: Secondary | ICD-10-CM

## 2022-07-14 DIAGNOSIS — M069 Rheumatoid arthritis, unspecified: Secondary | ICD-10-CM | POA: Diagnosis not present

## 2022-07-14 DIAGNOSIS — Z1159 Encounter for screening for other viral diseases: Secondary | ICD-10-CM | POA: Diagnosis not present

## 2022-07-14 DIAGNOSIS — M797 Fibromyalgia: Secondary | ICD-10-CM | POA: Diagnosis not present

## 2022-07-14 DIAGNOSIS — E559 Vitamin D deficiency, unspecified: Secondary | ICD-10-CM | POA: Diagnosis not present

## 2022-07-14 DIAGNOSIS — M545 Low back pain, unspecified: Secondary | ICD-10-CM | POA: Diagnosis not present

## 2022-07-14 DIAGNOSIS — M129 Arthropathy, unspecified: Secondary | ICD-10-CM | POA: Diagnosis not present

## 2022-07-14 DIAGNOSIS — Z131 Encounter for screening for diabetes mellitus: Secondary | ICD-10-CM | POA: Diagnosis not present

## 2022-07-14 DIAGNOSIS — R5383 Other fatigue: Secondary | ICD-10-CM | POA: Diagnosis not present

## 2022-07-14 DIAGNOSIS — R300 Dysuria: Secondary | ICD-10-CM | POA: Diagnosis not present

## 2022-07-14 DIAGNOSIS — Z79899 Other long term (current) drug therapy: Secondary | ICD-10-CM | POA: Diagnosis not present

## 2022-07-14 DIAGNOSIS — Z681 Body mass index (BMI) 19 or less, adult: Secondary | ICD-10-CM | POA: Diagnosis not present

## 2022-07-18 DIAGNOSIS — Z681 Body mass index (BMI) 19 or less, adult: Secondary | ICD-10-CM | POA: Diagnosis not present

## 2022-07-18 DIAGNOSIS — M069 Rheumatoid arthritis, unspecified: Secondary | ICD-10-CM | POA: Diagnosis not present

## 2022-07-18 DIAGNOSIS — Z131 Encounter for screening for diabetes mellitus: Secondary | ICD-10-CM | POA: Diagnosis not present

## 2022-07-18 DIAGNOSIS — Z79899 Other long term (current) drug therapy: Secondary | ICD-10-CM | POA: Diagnosis not present

## 2022-07-20 DIAGNOSIS — Z79899 Other long term (current) drug therapy: Secondary | ICD-10-CM | POA: Diagnosis not present

## 2022-08-04 ENCOUNTER — Encounter (HOSPITAL_COMMUNITY): Payer: Self-pay

## 2022-08-04 ENCOUNTER — Ambulatory Visit (HOSPITAL_COMMUNITY): Payer: Medicare HMO

## 2022-08-17 DIAGNOSIS — Z681 Body mass index (BMI) 19 or less, adult: Secondary | ICD-10-CM | POA: Diagnosis not present

## 2022-08-17 DIAGNOSIS — F32A Depression, unspecified: Secondary | ICD-10-CM | POA: Diagnosis not present

## 2022-08-17 DIAGNOSIS — Z79899 Other long term (current) drug therapy: Secondary | ICD-10-CM | POA: Diagnosis not present

## 2022-08-17 DIAGNOSIS — Z1331 Encounter for screening for depression: Secondary | ICD-10-CM | POA: Diagnosis not present

## 2022-08-17 DIAGNOSIS — M069 Rheumatoid arthritis, unspecified: Secondary | ICD-10-CM | POA: Diagnosis not present

## 2022-08-17 DIAGNOSIS — M545 Low back pain, unspecified: Secondary | ICD-10-CM | POA: Diagnosis not present

## 2022-08-17 DIAGNOSIS — M797 Fibromyalgia: Secondary | ICD-10-CM | POA: Diagnosis not present

## 2022-08-23 DIAGNOSIS — M79672 Pain in left foot: Secondary | ICD-10-CM | POA: Diagnosis not present

## 2022-08-23 DIAGNOSIS — M79671 Pain in right foot: Secondary | ICD-10-CM | POA: Diagnosis not present

## 2022-08-26 DIAGNOSIS — M7741 Metatarsalgia, right foot: Secondary | ICD-10-CM | POA: Diagnosis not present

## 2022-08-26 DIAGNOSIS — M7742 Metatarsalgia, left foot: Secondary | ICD-10-CM | POA: Diagnosis not present

## 2022-08-26 DIAGNOSIS — M06371 Rheumatoid nodule, right ankle and foot: Secondary | ICD-10-CM | POA: Diagnosis not present

## 2022-08-26 DIAGNOSIS — M0569 Rheumatoid arthritis of multiple sites with involvement of other organs and systems: Secondary | ICD-10-CM | POA: Diagnosis not present

## 2022-08-26 DIAGNOSIS — F17200 Nicotine dependence, unspecified, uncomplicated: Secondary | ICD-10-CM | POA: Diagnosis not present

## 2022-09-13 DIAGNOSIS — H2513 Age-related nuclear cataract, bilateral: Secondary | ICD-10-CM | POA: Diagnosis not present

## 2022-09-15 DIAGNOSIS — H524 Presbyopia: Secondary | ICD-10-CM | POA: Diagnosis not present

## 2022-09-27 DIAGNOSIS — M545 Low back pain, unspecified: Secondary | ICD-10-CM | POA: Diagnosis not present

## 2022-09-27 DIAGNOSIS — M069 Rheumatoid arthritis, unspecified: Secondary | ICD-10-CM | POA: Diagnosis not present

## 2022-09-27 DIAGNOSIS — Z79899 Other long term (current) drug therapy: Secondary | ICD-10-CM | POA: Diagnosis not present

## 2022-09-27 DIAGNOSIS — Z681 Body mass index (BMI) 19 or less, adult: Secondary | ICD-10-CM | POA: Diagnosis not present

## 2022-09-27 DIAGNOSIS — M797 Fibromyalgia: Secondary | ICD-10-CM | POA: Diagnosis not present

## 2022-09-29 DIAGNOSIS — M0579 Rheumatoid arthritis with rheumatoid factor of multiple sites without organ or systems involvement: Secondary | ICD-10-CM | POA: Diagnosis not present

## 2022-09-29 DIAGNOSIS — M810 Age-related osteoporosis without current pathological fracture: Secondary | ICD-10-CM | POA: Diagnosis not present

## 2022-09-29 DIAGNOSIS — Z79899 Other long term (current) drug therapy: Secondary | ICD-10-CM | POA: Diagnosis not present

## 2022-10-21 DIAGNOSIS — R5383 Other fatigue: Secondary | ICD-10-CM | POA: Diagnosis not present

## 2022-10-21 DIAGNOSIS — F17200 Nicotine dependence, unspecified, uncomplicated: Secondary | ICD-10-CM | POA: Insufficient documentation

## 2022-10-27 DIAGNOSIS — M7741 Metatarsalgia, right foot: Secondary | ICD-10-CM | POA: Diagnosis not present

## 2022-10-27 DIAGNOSIS — M0569 Rheumatoid arthritis of multiple sites with involvement of other organs and systems: Secondary | ICD-10-CM | POA: Diagnosis not present

## 2022-10-27 DIAGNOSIS — M06371 Rheumatoid nodule, right ankle and foot: Secondary | ICD-10-CM | POA: Diagnosis not present

## 2022-10-27 DIAGNOSIS — M7742 Metatarsalgia, left foot: Secondary | ICD-10-CM | POA: Diagnosis not present

## 2022-10-28 DIAGNOSIS — M069 Rheumatoid arthritis, unspecified: Secondary | ICD-10-CM | POA: Diagnosis not present

## 2022-10-28 DIAGNOSIS — Z681 Body mass index (BMI) 19 or less, adult: Secondary | ICD-10-CM | POA: Diagnosis not present

## 2022-10-28 DIAGNOSIS — M545 Low back pain, unspecified: Secondary | ICD-10-CM | POA: Diagnosis not present

## 2022-10-28 DIAGNOSIS — Z79899 Other long term (current) drug therapy: Secondary | ICD-10-CM | POA: Diagnosis not present

## 2022-10-28 DIAGNOSIS — M797 Fibromyalgia: Secondary | ICD-10-CM | POA: Diagnosis not present

## 2022-11-15 DIAGNOSIS — K429 Umbilical hernia without obstruction or gangrene: Secondary | ICD-10-CM | POA: Diagnosis not present

## 2022-11-15 DIAGNOSIS — M79671 Pain in right foot: Secondary | ICD-10-CM | POA: Diagnosis not present

## 2022-11-15 DIAGNOSIS — F909 Attention-deficit hyperactivity disorder, unspecified type: Secondary | ICD-10-CM | POA: Diagnosis not present

## 2022-11-15 DIAGNOSIS — F17200 Nicotine dependence, unspecified, uncomplicated: Secondary | ICD-10-CM | POA: Diagnosis not present

## 2022-11-15 DIAGNOSIS — M79672 Pain in left foot: Secondary | ICD-10-CM | POA: Diagnosis not present

## 2022-11-15 DIAGNOSIS — R5383 Other fatigue: Secondary | ICD-10-CM | POA: Diagnosis not present

## 2022-11-29 ENCOUNTER — Ambulatory Visit: Payer: Medicare HMO

## 2022-11-29 ENCOUNTER — Ambulatory Visit: Payer: Medicare HMO | Admitting: Podiatry

## 2022-11-29 DIAGNOSIS — M79672 Pain in left foot: Secondary | ICD-10-CM

## 2022-11-29 DIAGNOSIS — M71372 Other bursal cyst, left ankle and foot: Secondary | ICD-10-CM

## 2022-11-29 DIAGNOSIS — M71371 Other bursal cyst, right ankle and foot: Secondary | ICD-10-CM | POA: Diagnosis not present

## 2022-11-29 DIAGNOSIS — M778 Other enthesopathies, not elsewhere classified: Secondary | ICD-10-CM

## 2022-11-29 DIAGNOSIS — S93104S Unspecified dislocation of right toe(s), sequela: Secondary | ICD-10-CM

## 2022-11-29 DIAGNOSIS — M7741 Metatarsalgia, right foot: Secondary | ICD-10-CM

## 2022-11-29 NOTE — Progress Notes (Unsigned)
Chief Complaint  Patient presents with   Foot Pain   HPI: 67 y.o. female presents today requesting surgery on her feet.  States she had surgery back in [place] / [place] area on a "mass" in the right forefoot.  States it gave her relief for a few years, but it's back and she wants it cut out again.  She also had a resultant dislocated 3rd toe after the surgery and wants this fixed as well.  Previous notes from Dr. Al Corpus in 2022 reveal a workup for a cyst in the right foot near the 2nd and 3rd toes.  But, it doesn't appear he ever proceeded with surgery after requesting clearance from rheumatology and pain management.  She also has fibromyalgia and noted she has erythromelalgia and is a current smoker.  She notes she has enlarged masses near both 5th toes, and thought this was part of getting older.  Notes she doesn't really have pain in those areas.    She points to the plantar aspect of the 2nd, 3rd, and 4th met heads bilateral, stating these areas are killing her and she cannot walk, although there was no observable limp or antalgic gait when watching the patient being escorted back to exam room today.  She states this is her major concern and wants surgery to fix it.  But, she then kept referring to a previous surgery and states that's the surgery she wants again.  When asked what her diagnosis was for this surgery, she didn't know.    She notes her R.A. gets pretty bad, but feels like she has it under good control right now at 5mg  prednisone daily.  She got orthotics from her doctor in [place], but doesn't wear them all the time.  She does not have them in her shoes today for review, but she stated they were old.    Denies burning or radiating pain, but notes significant pain to the previously mentioned met head bilateral.   Past Medical History:  Diagnosis Date   BMS (burning mouth syndrome)    Erythromelalgia ([facility])    Fibromyalgia    Rheumatoid arthritis ([facility])     Past  Surgical History:  Procedure Laterality Date   AUGMENTATION MAMMAPLASTY     breast implants now removed   CARPOMETACARPEL SUSPENSION PLASTY Left 03/05/2021   Procedure: LEFT THUMB TRAPEZIECTOMY AND SUSPENSION PLASTY;  Surgeon: Betha Loa, MD;  Location: [facility];  Service: Orthopedics;  Laterality: Left;  Regional block   FOOT SURGERY Bilateral    for rheumatoid arthritis   HERNIA REPAIR     WRIST SURGERY      Allergies  Allergen Reactions   Adalimumab Hives   Infliximab Hives   Milnacipran Other (See Comments)     mood changes   Other     Humera; hives; Allergy; 06/12/2010  Savella; mood changes; Unspecified; 06/12/2010   Pregabalin Rash    Physical Exam: General: The patient is alert and oriented x3 in no acute distress.  Dermatology: Skin is warm, dry and supple bilateral lower extremities. Interspaces are clear of maceration and debris.  No hyperkeratotic build-up bilateral. There are soft tissue, mobile masses on lateral 5th met head bilateral.  No pain on palpation of masses.  Metatarsal head is not pressing against skin laterally.    Vascular: Palpable pedal pulses bilaterally. Capillary refill within normal limits.  No erythema or calor.  Neurological: Light touch sensation grossly intact bilateral feet.   Musculoskeletal Exam: POP  dorsal aspect of right 3rd toe at site of dislocation on x-ray.  No pain on palpation of lateral 5th met head masses bilateral.  Diffuse pain on palpation of 2nd, 3rd, and 4th met heads bilaterally.  Negative Muldor's sign with compression of interspaces.    Radiographic Exam:  Patient brought paper print-outs of her foot x-rays today from early 2024, as well as an MRI disc from an outside facility.  Could not get the MRI images to open from the cd.  Medical assistant stated patient refused x-rays to be obtained today since she had recent x-rays on paper with her to share today.   Assessment/Plan of Care: 1. Metatarsalgia of  both feet   2. Other bursal cyst, right ankle and foot   3. Other bursal cyst, left ankle and foot   4. Dislocated toe, right, sequela     Discussed clinical findings with patient today.  Informed patient the x-rays she provided that she stated were from 2024, were actually from 2023.  She then looked through her binder of medical papers for the most recent radiographs printed on paper and handed those to me, as well as an old op report that only mentioned removal of mass, without identification/pathology report.    Informed patient I prefer to take a more conservative approach and am willing to surgically correct the dislocated 3rd toe, and possibly even the bursal cysts on the lateral 5th met heads bilaterally since this can be irritated by closed toe shoes, but her metatarsals look good on the xrays she provided and with her R.A., I'd rather not perform surgery to the 2nd - 4th metatarsals and possibly cause a rheumatoid flare up when the joints look good right now.  Informed her that R.A. typically attacks the MPJ's, so she would be better managed via new orthotics with a met bar or met pad to off-load the painful metatarsal heads, rather than have surgery.  She then stated her old orthotics were fine and she didn't need new ones.  She adamantly refused to agree to a conservative approach first, saying that's the only reason she came was to have surgery done.  I told her the risks of surgery are more involved with her autoimmune disorder, and she had stated initially that her R.A. was pretty bad.  She was willing to undergo surgery even if it fails within the next 5 years.    Informed the patient we don't seem to be a good fit with regard to her expectations of surgery on initial visit.  She is willing to be evaluated by a different surgeon here, so she was referred to Dr. Lilian Kapur who may have better insight to her concerns and be able to offer other options she is more amenable with.   Over 45  minutes were spent with this patient today, trying to obtain a clear history of her chief complaint, allow her time to find her old x-rays since we weren't permitted to have new ones obtained today, try to pull up her MRI images, evaluate the foot, review previous op report, and discuss treatment options.  Clerance Lav, DPM, FACFAS [facility]     [address], [place] [address]  Office [phone]  Fax [phone]

## 2022-11-30 ENCOUNTER — Encounter: Payer: Self-pay | Admitting: Podiatry

## 2022-12-07 ENCOUNTER — Ambulatory Visit: Payer: Medicare HMO | Admitting: Podiatry

## 2022-12-07 DIAGNOSIS — M0639 Rheumatoid nodule, multiple sites: Secondary | ICD-10-CM | POA: Diagnosis not present

## 2022-12-07 DIAGNOSIS — M21962 Unspecified acquired deformity of left lower leg: Secondary | ICD-10-CM

## 2022-12-07 DIAGNOSIS — M21961 Unspecified acquired deformity of right lower leg: Secondary | ICD-10-CM

## 2022-12-07 DIAGNOSIS — M069 Rheumatoid arthritis, unspecified: Secondary | ICD-10-CM

## 2022-12-07 NOTE — Progress Notes (Unsigned)
Subjective:  Patient ID: Krista Harris, female    DOB: Dec 31, 1955,  MRN: [id number]  Chief Complaint  Patient presents with   Foot Pain    Pt presents today for a surgery consult    67 y.o. female presents with the above complaint. History confirmed with patient.  She presents for follow-up having a complex history of bilateral foot issues, she has a history of rheumatoid arthritis has tried multiple rheumatic drugs, currently is on 5 mg of prednisone trying to decrease this because she would like to have surgery on her feet for her issues.  Previously she has seen Dr. Al Corpus, surgery for excision of a mass on the right foot was scheduled but then canceled, her surgery was canceled due to the need for a atlantoaxial spinal x-ray as a recommendation from her rheumatologist, she was not happy when this happened, she ended up having surgery at [facility], this was last November.  She says her left foot is now an issue  Objective:  Physical Exam: warm, good capillary refill, no trophic changes or ulcerative lesions, normal DP and PT pulses, normal sensory exam, and bilaterally she has digital contractures, range of motion of the MTPs is intact there is some tenderness with range of motion, tenderness to palpation to the prominent metatarsal heads plantarly and soft tissue lesions in the interspaces and the lateral fifth metatarsals.   Right foot soft tissue pathology 02/10/2022 A.  SOFT TISSUE, FOOT, RIGHT, EXCISION:              Fibrous tissue with fibrinous necrosis and palisading granulomas, consistent with rheumatoid arthritis. Negative for neoplasm or malignancy.        Assessment:  No diagnosis found.   Plan:  Patient was evaluated and treated and all questions answered.  I spent time today with the patient reviewing multiple records including her previous operative report that she has, pathology reports via care everywhere from [facility] as well as 2 new MRIs on  both feet completed in June of this year both on CD as well as the reports which I have scanned into her chart.  Previous excision of 1 of these masses on the right foot last year revealed soft tissue consistent with rheumatoid arthritis and synovitis.  I also independently reviewed the images which does show significant soft tissue swelling capsulitis and inflammation around the metatarsal phalangeal joints.  To me these represent significant rheumatoid nodule formation.  She has these in her hands as well.  She has persistent that simply removing the affected areas of soft tissue would offer her relief but I discussed with her this would not offer a long-term solution due to the progressive nature of her disease.  Removing the soft tissue and joint capsule would lead to instability and joint arthrosis and metatarsal head resection would be necessary at the same time.  She is not interested in pursuing surgery such as this.  She says that the surgery had to remove 1 last year worked although she also had a different time states that she still has pain and issues with that toe, when I discussed with her that that is my exact reasoning that simple removal of the synovitis is not going to be successful she states "well that surgeon did not know what he was doing".  I also discussed with her that she would need to be non-smoking and off prednisone before proceeding with surgery due to the risk of nonhealing.  She says that she  disagrees with my above assessment and that she knows what is exactly wrong with her body but "you doctors in the [place] do not know what you are doing there is something wrong with the education system here".  She says she is going to try to contact her doctor in the [place] that helped her before which I encouraged her to do so but there is not much more that I can offer her currently.   A total of 41 minutes was spent today on the review of the patients medical record including previous  laboratory values, imaging studies, taking of the history, examining the patient, and documentation in the chart.  Return if symptoms worsen or fail to improve.

## 2022-12-13 ENCOUNTER — Other Ambulatory Visit (INDEPENDENT_AMBULATORY_CARE_PROVIDER_SITE_OTHER): Payer: Medicare HMO

## 2022-12-13 ENCOUNTER — Other Ambulatory Visit (INDEPENDENT_AMBULATORY_CARE_PROVIDER_SITE_OTHER): Payer: Self-pay

## 2022-12-13 ENCOUNTER — Ambulatory Visit: Payer: Medicare HMO | Admitting: Orthopedic Surgery

## 2022-12-13 DIAGNOSIS — M79672 Pain in left foot: Secondary | ICD-10-CM

## 2022-12-13 DIAGNOSIS — M79671 Pain in right foot: Secondary | ICD-10-CM

## 2022-12-19 ENCOUNTER — Encounter: Payer: Self-pay | Admitting: Orthopedic Surgery

## 2022-12-19 NOTE — Progress Notes (Signed)
Office Visit Note   Patient: Krista Harris           Date of Birth: 07-28-1955           MRN: [id number] Visit Date: 12/13/2022              Requested by: Benita Stabile, MD [address] [place],  [place] [address] PCP: Benita Stabile, MD  Chief Complaint  Patient presents with   Right Foot - Pain   Left Foot - Pain      HPI: Patient is a 67 year old woman who is seen for initial evaluation for bilateral foot pain.  Patient states she feels like something is moving around in her foot.  Patient does have a history of rheumatoid arthritis and is on medication for this.  She is also on prednisone and history of tobacco use.  Patient states she has had an excision of a mass in her left foot which revealed rheumatoid arthritis last year.  Patient states that she has had 8 other consults regarding her feet prior to today.  She states she is also going up [place] to see another physician.  Assessment & Plan: Visit Diagnoses:  1. Bilateral foot pain     Plan: Discussed that surgical intervention for the left foot would include a Weil osteotomy of the second third and fourth metatarsals as outpatient surgery.  Patient states she would like to proceed with surgery on the left foot first.  Discussed that she would need to stop the methotrexate and disease modifying drug prior to surgery and discussed the importance of smoking cessation.  Patient states she understands wished to proceed with surgery at this time.  Follow-Up Instructions: No follow-ups on file.   Ortho Exam  Patient is alert, oriented, no adenopathy, well-dressed, normal affect, normal respiratory effort. Examination patient has a good dorsalis pedis pulse bilaterally.  She has swelling beneath the second and third metatarsal heads bilaterally and tenderness to palpation bilaterally beneath the second and third metatarsal heads.  Patient states he is on prednisone methotrexate and a disease modifying  drug.  Imaging: No results found. No images are attached to the encounter.  Labs: Lab Results  Component Value Date   ESRSEDRATE 19 05/06/2017   CRP <0.8 05/06/2017     Lab Results  Component Value Date   ALBUMIN 4.2 05/06/2017   ALBUMIN 4.2 03/23/2017   ALBUMIN 4.1 07/26/2016    No results found for: "MG" No results found for: "VD25OH"  No results found for: "PREALBUMIN"    Latest Ref Rng & Units 05/06/2017    2:03 PM 03/23/2017    3:42 PM 07/26/2016   11:34 AM  CBC EXTENDED  WBC 4.0 - 10.5 K/uL 5.0  6.8  6.6   RBC 3.87 - 5.11 MIL/uL 4.24  4.34  4.32   Hemoglobin 12.0 - 15.0 g/dL 78.4  69.6  29.5   HCT 36.0 - 46.0 % 43.1  45.0  43.3   Platelets 150 - 400 K/uL 270  331  304   NEUT# 1.7 - 7.7 K/uL   3.5   Lymph# 0.7 - 4.0 K/uL   2.6      There is no height or weight on file to calculate BMI.  Orders:  Orders Placed This Encounter  Procedures   XR Foot Complete Right   XR Foot Complete Left   No orders of the defined types were placed in this encounter.    Procedures: No procedures  performed  Clinical Data: No additional findings.  ROS:  All other systems negative, except as noted in the HPI. Review of Systems  Objective: Vital Signs: There were no vitals taken for this visit.  Specialty Comments:  No specialty comments available.  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   Dry mouth  11/10/2017   Irritable bowel syndrome 11/04/2017   Primary fibromyalgia syndrome 11/04/2017   Diarrhea 04/05/2017   History of colonic polyps 04/05/2017   Attention deficit hyperactivity disorder, 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   Social phobia 04/08/2014   Past Medical History:  Diagnosis Date   BMS (burning mouth syndrome)    Erythromelalgia (HCC)    Fibromyalgia    Rheumatoid arthritis (HCC)     Family History  Problem Relation Age of Onset   High blood pressure Mother    High Cholesterol Mother    Congestive Heart Failure Father     Past Surgical History:  Procedure Laterality Date   AUGMENTATION MAMMAPLASTY     breast implants now removed   CARPOMETACARPEL SUSPENSION PLASTY Left 03/05/2021   Procedure: LEFT THUMB TRAPEZIECTOMY AND SUSPENSION PLASTY;  Surgeon: Betha Loa, MD;  Location:  [facility];  Service: Orthopedics;  Laterality: Left;  Regional block   FOOT SURGERY Bilateral    for rheumatoid arthritis   HERNIA REPAIR     WRIST SURGERY     Social History   Occupational History   Not on file  Tobacco Use   Smoking status: Every Day    Current packs/day: 0.50    Types: Cigarettes   Smokeless tobacco: Never  Substance and Sexual Activity   Alcohol use: No   Drug use: No   Sexual activity: Not on file

## 2022-12-23 ENCOUNTER — Ambulatory Visit: Payer: Medicare HMO | Admitting: Orthopedic Surgery

## 2022-12-28 ENCOUNTER — Other Ambulatory Visit: Payer: Self-pay

## 2023-01-10 NOTE — Progress Notes (Signed)
Called patient attempted to do pre op instructions with her.  Patient states " I am cancelling my surgery."

## 2023-01-12 ENCOUNTER — Ambulatory Visit (HOSPITAL_COMMUNITY): Admission: RE | Admit: 2023-01-12 | Payer: Medicare HMO | Source: Home / Self Care | Admitting: Orthopedic Surgery

## 2023-01-12 ENCOUNTER — Encounter (HOSPITAL_COMMUNITY): Admission: RE | Payer: Self-pay | Source: Home / Self Care

## 2023-01-12 SURGERY — OSTEOTOMY, WEIL
Anesthesia: Choice | Laterality: Left

## 2023-01-21 ENCOUNTER — Encounter: Payer: Medicare HMO | Admitting: Family

## 2023-03-31 DIAGNOSIS — M79672 Pain in left foot: Secondary | ICD-10-CM | POA: Diagnosis not present

## 2023-03-31 DIAGNOSIS — M19049 Primary osteoarthritis, unspecified hand: Secondary | ICD-10-CM | POA: Diagnosis not present

## 2023-03-31 DIAGNOSIS — M545 Low back pain, unspecified: Secondary | ICD-10-CM | POA: Diagnosis not present

## 2023-03-31 DIAGNOSIS — M79671 Pain in right foot: Secondary | ICD-10-CM | POA: Diagnosis not present

## 2023-04-15 DIAGNOSIS — M545 Low back pain, unspecified: Secondary | ICD-10-CM | POA: Diagnosis not present

## 2023-04-15 DIAGNOSIS — Z79899 Other long term (current) drug therapy: Secondary | ICD-10-CM | POA: Diagnosis not present

## 2023-04-15 DIAGNOSIS — Z0000 Encounter for general adult medical examination without abnormal findings: Secondary | ICD-10-CM | POA: Diagnosis not present

## 2023-04-15 DIAGNOSIS — Z681 Body mass index (BMI) 19 or less, adult: Secondary | ICD-10-CM | POA: Diagnosis not present

## 2023-04-15 DIAGNOSIS — F909 Attention-deficit hyperactivity disorder, unspecified type: Secondary | ICD-10-CM | POA: Diagnosis not present

## 2023-04-15 DIAGNOSIS — M797 Fibromyalgia: Secondary | ICD-10-CM | POA: Diagnosis not present

## 2023-05-23 ENCOUNTER — Telehealth: Payer: Self-pay | Admitting: Podiatry

## 2023-05-23 NOTE — Telephone Encounter (Signed)
 Pt left message today at 1025 stating she needed an appt.  I returned call and left message for pt to call to schedule an appt.

## 2023-09-30 ENCOUNTER — Encounter: Payer: Self-pay | Admitting: Internal Medicine

## 2023-10-05 ENCOUNTER — Encounter (INDEPENDENT_AMBULATORY_CARE_PROVIDER_SITE_OTHER): Payer: Self-pay | Admitting: *Deleted

## 2023-10-10 ENCOUNTER — Encounter (INDEPENDENT_AMBULATORY_CARE_PROVIDER_SITE_OTHER): Payer: Self-pay | Admitting: Gastroenterology

## 2023-10-10 ENCOUNTER — Ambulatory Visit (INDEPENDENT_AMBULATORY_CARE_PROVIDER_SITE_OTHER): Admitting: Gastroenterology

## 2023-10-10 VITALS — BP 121/68 | HR 87 | Temp 97.8°F | Ht 64.0 in | Wt 93.6 lb

## 2023-10-10 DIAGNOSIS — E46 Unspecified protein-calorie malnutrition: Secondary | ICD-10-CM | POA: Insufficient documentation

## 2023-10-10 DIAGNOSIS — R634 Abnormal weight loss: Secondary | ICD-10-CM | POA: Insufficient documentation

## 2023-10-10 DIAGNOSIS — E440 Moderate protein-calorie malnutrition: Secondary | ICD-10-CM

## 2023-10-10 NOTE — Progress Notes (Unsigned)
 Toribio Fortune, M.D. Gastroenterology & Hepatology [facility] Gastroenterology [address] [place], [place] [address] Primary Care Physician: Shona Norleen PEDLAR, MD [address] [place] [address]  Referring MD: PCP  Chief Complaint:  weight loss  History of Present Illness: Krista Harris is a 68 y.o. female with past medical history of fibromyalgia, erythromelalgia, burning mouth syndrome, rheumatoid arthritis, who presents for evaluation of  weight loss.  Patient states she has been losing weight for the last 2 years. She has lost 13 lb since then. She states she has good appetite and eats significantly well, although because of her RA she is limited ina  lot of her physical activities. However, she states she eats a lot of food that she says is eating a lot of junk food, such as seltzers and chips. Tries Carnation in the morning but cannot afford protein shakes.  States her RA is on management with methotrexate and prednisone  10 mg qday. Has had treatment for her RA with multiple treatments.  She is having 1 BM per day, without any blood or melena. Stools are formed. The patient denies having any nausea, vomiting, fever, chills, hematochezia, melena, hematemesis, abdominal distention, abdominal pain, diarrhea, jaundice, pruritus.  Takes hydrocodone  for pain control.  Labs from referring office from 08/04/2023 showed a TSH of 2.97, free T41.22, U tox positive for opiates and hydrocodone , other labs from 07/11/2023 WBC 7.4, hemoglobin 13.5, platelets 324, CMP with normal electrolytes, creatinine 0.5, BUN 21, alkaline phosphatase 72, AST 20, ALT 15, total bilirubin 0.83.  Last ZHI:fjwb years ago Last Colonoscopy:possibly 6-7 years, states it was normal, performed at [facility] and was advised to repeat in 5 years.  FHx: neg for any gastrointestinal/liver disease, no malignancies Social: smokes no filter cigarettes 1 pack every 2-3 days, neg  alcohol or illicit drug use Surgical: abdominal hernia  Past Medical History: Past Medical History:  Diagnosis Date   BMS (burning mouth syndrome)    Erythromelalgia (HCC)    Fibromyalgia    Rheumatoid arthritis (HCC)     Past Surgical History: Past Surgical History:  Procedure Laterality Date   AUGMENTATION MAMMAPLASTY     breast implants now removed   CARPOMETACARPEL SUSPENSION PLASTY Left 03/05/2021   Procedure: LEFT THUMB TRAPEZIECTOMY AND SUSPENSION PLASTY;  Surgeon: Murrell Drivers, MD;  Location: [facility];  Service: Orthopedics;  Laterality: Left;  Regional block   FOOT SURGERY Bilateral    for rheumatoid arthritis   HERNIA REPAIR     WRIST SURGERY      Family History: Family History  Problem Relation Age of Onset   High blood pressure Mother    High Cholesterol Mother    Congestive Heart Failure Father     Social History: Social History   Tobacco Use  Smoking Status Every Day   Current packs/day: 0.50   Types: Cigarettes  Smokeless Tobacco Never   Social History   Substance and Sexual Activity  Alcohol Use No   Social History   Substance and Sexual Activity  Drug Use No    Allergies: Allergies  Allergen Reactions   Adalimumab Hives   Infliximab Hives   Milnacipran Other (See Comments)     mood changes   Other     Humera; hives; Allergy; 06/12/2010  Savella; mood changes; Unspecified; 06/12/2010   Pregabalin Rash    Medications: Current Outpatient Medications  Medication Sig Dispense Refill   aspirin 325 MG tablet Take 325 mg by mouth  daily as needed for mild pain.     clonazePAM (KLONOPIN) 1 MG tablet Take 1 mg by mouth daily as needed for anxiety.     folic acid  (FOLVITE ) 1 MG tablet Take 1 mg by mouth daily.     gabapentin (NEURONTIN) 600 MG tablet Take 600 mg by mouth 3 (three) times daily.     methotrexate (RHEUMATREX) 2.5 MG tablet Take 10 mg by mouth once a week.     mupirocin ointment (BACTROBAN) 2 % Apply 1  Application topically 3 (three) times daily.     predniSONE  (DELTASONE ) 10 MG tablet Take 10 mg by mouth daily with breakfast.     Vitamin D, Ergocalciferol, (DRISDOL) 1.25 MG (50000 UNIT) CAPS capsule Take 50,000 Units by mouth once a week.     zoledronic  acid (RECLAST ) 5 MG/100ML SOLN injection Inject 5 mg into the vein once. Yearly June     No current facility-administered medications for this visit.    Review of Systems: GENERAL: negative for malaise, night sweats HEENT: No changes in hearing or vision, no nose bleeds or other nasal problems. NECK: Negative for lumps, goiter, pain and significant neck swelling RESPIRATORY: Negative for cough, wheezing CARDIOVASCULAR: Negative for chest pain, leg swelling, palpitations, orthopnea GI: SEE HPI MUSCULOSKELETAL: Negative for joint pain or swelling, back pain, and muscle pain. SKIN: Negative for lesions, rash PSYCH: Negative for sleep disturbance, mood disorder and recent psychosocial stressors. HEMATOLOGY Negative for prolonged bleeding, bruising easily, and swollen nodes. ENDOCRINE: Negative for cold or heat intolerance, polyuria, polydipsia and goiter. NEURO: negative for tremor, gait imbalance, syncope and seizures. The remainder of the review of systems is noncontributory.   Physical Exam: BP 121/68 (BP Location: Left Arm, Patient Position: Sitting, Cuff Size: Small)   Pulse 87   Temp 97.8 F (36.6 C) (Temporal)   Ht 5' 4 (1.626 m)   Wt 93 lb 9.6 oz (42.5 kg)   BMI 16.07 kg/m  GENERAL: The patient is AO x3, in no acute distress. Underweight. HEENT: Head is normocephalic and atraumatic. EOMI are intact. Mouth is well hydrated and without lesions. NECK: Supple. No masses LUNGS: Clear to auscultation. No presence of rhonchi/wheezing/rales. Adequate chest expansion HEART: RRR, normal s1 and s2. ABDOMEN: Soft, nontender, no guarding, no peritoneal signs, and nondistended. BS +.  Has presence of a mid abdominal  hernia. EXTREMITIES: Without any cyanosis, clubbing, rash, lesions or edema. NEUROLOGIC: AOx3, no focal motor deficit. SKIN: no jaundice, no rashes   Imaging/Labs: as above  I personally reviewed and interpreted the available labs, imaging and endoscopic files.  Impression and Plan: Krista Harris is a 68 y.o. female with past medical history of fibromyalgia, erythromelalgia, burning mouth syndrome, rheumatoid arthritis, who presents for evaluation of  weight loss.  Patient has presented issues with progressive weight loss without associated gastrointestinal symptoms.  She has had regular bowel movements without changes in the consistency or presence of overt bleeding.  Nevertheless, she has a very low BMI in the range of malnutrition.  We discussed that we could evaluate for endoluminal etiologies for weight loss with an EGD and colonoscopy, but if this is unremarkable we will need to proceed with cross-sectional abdominal imaging of her chest, abdomen and pelvis.  She is in agreement with this.  -Schedule EGD and colonoscopy -If possible to afford, please take 1-3 protein shakes daily -If negative endoscopic evaluations, we will need to proceed with a CT of the chest, abdomen pelvis with IV contrast.  All questions were  answered.      Toribio Fortune, MD Gastroenterology and Hepatology [facility]

## 2023-10-10 NOTE — Patient Instructions (Addendum)
 Schedule EGD and colonoscopy If possible to afford, please take 1-3 protein shakes daily

## 2023-10-12 ENCOUNTER — Encounter: Payer: Self-pay | Admitting: *Deleted

## 2023-10-17 ENCOUNTER — Encounter: Payer: Self-pay | Admitting: *Deleted

## 2023-10-17 ENCOUNTER — Other Ambulatory Visit: Payer: Self-pay | Admitting: *Deleted

## 2023-10-17 MED ORDER — PEG 3350-KCL-NA BICARB-NACL 420 G PO SOLR: 4000.0000 mL | Freq: Once | ORAL | 0 refills | Status: AC

## 2023-10-19 ENCOUNTER — Ambulatory Visit (INDEPENDENT_AMBULATORY_CARE_PROVIDER_SITE_OTHER): Admitting: Gastroenterology

## 2023-10-21 ENCOUNTER — Encounter (HOSPITAL_COMMUNITY)
Admission: RE | Admit: 2023-10-21 | Discharge: 2023-10-21 | Disposition: A | Discharge status: Home / Self Care | Source: Ambulatory Visit | Attending: Gastroenterology | Admitting: Gastroenterology

## 2023-10-24 ENCOUNTER — Telehealth: Payer: Self-pay | Admitting: *Deleted

## 2023-10-24 NOTE — Telephone Encounter (Signed)
 Pt called and stated that she broke her tooth and is in a lot of pain. She is in the process of finding a dentist to get it fixed. She states she will call back to reschedule once she gets tooth fixed.

## 2023-10-24 NOTE — Telephone Encounter (Signed)
 Per Pam in endo Ms Yazdi has a tooth ache and needs to reschedule  Patient was on for double procedure tomorrow with Dr. Castaneda

## 2023-10-25 ENCOUNTER — Ambulatory Visit (HOSPITAL_COMMUNITY): Admission: RE | Admit: 2023-10-25 | Source: Home / Self Care | Admitting: Gastroenterology

## 2023-10-25 ENCOUNTER — Encounter (HOSPITAL_COMMUNITY): Admission: RE | Payer: Self-pay | Source: Home / Self Care

## 2023-10-25 SURGERY — COLONOSCOPY
Anesthesia: Choice

## 2023-10-31 NOTE — Telephone Encounter (Signed)
 LMTCB for pt

## 2023-11-01 DIAGNOSIS — E618 Deficiency of other specified nutrient elements: Secondary | ICD-10-CM | POA: Diagnosis not present

## 2023-11-01 DIAGNOSIS — Z681 Body mass index (BMI) 19 or less, adult: Secondary | ICD-10-CM | POA: Diagnosis not present

## 2023-11-01 DIAGNOSIS — M0579 Rheumatoid arthritis with rheumatoid factor of multiple sites without organ or systems involvement: Secondary | ICD-10-CM | POA: Diagnosis not present

## 2023-11-01 DIAGNOSIS — Z79899 Other long term (current) drug therapy: Secondary | ICD-10-CM | POA: Diagnosis not present

## 2023-11-01 DIAGNOSIS — F17210 Nicotine dependence, cigarettes, uncomplicated: Secondary | ICD-10-CM | POA: Diagnosis not present

## 2023-11-01 DIAGNOSIS — M797 Fibromyalgia: Secondary | ICD-10-CM | POA: Diagnosis not present

## 2023-11-01 DIAGNOSIS — G629 Polyneuropathy, unspecified: Secondary | ICD-10-CM | POA: Diagnosis not present

## 2023-11-30 ENCOUNTER — Telehealth (INDEPENDENT_AMBULATORY_CARE_PROVIDER_SITE_OTHER): Payer: Self-pay

## 2023-11-30 NOTE — Telephone Encounter (Signed)
 Thanks

## 2023-11-30 NOTE — Telephone Encounter (Signed)
 Pt left vm wanting to reschedule colonoscopy but refused the egd. I called pt back and rescheduled her colonoscopy for 12/14/2023 at 9:00 am. Pt states she still has prep (rx). Sending instructions through the mail.

## 2023-12-02 DIAGNOSIS — M0579 Rheumatoid arthritis with rheumatoid factor of multiple sites without organ or systems involvement: Secondary | ICD-10-CM | POA: Diagnosis not present

## 2023-12-02 DIAGNOSIS — Z9181 History of falling: Secondary | ICD-10-CM | POA: Diagnosis not present

## 2023-12-02 DIAGNOSIS — F17210 Nicotine dependence, cigarettes, uncomplicated: Secondary | ICD-10-CM | POA: Diagnosis not present

## 2023-12-02 DIAGNOSIS — M797 Fibromyalgia: Secondary | ICD-10-CM | POA: Diagnosis not present

## 2023-12-02 DIAGNOSIS — Z79899 Other long term (current) drug therapy: Secondary | ICD-10-CM | POA: Diagnosis not present

## 2023-12-02 DIAGNOSIS — R0602 Shortness of breath: Secondary | ICD-10-CM | POA: Diagnosis not present

## 2023-12-02 DIAGNOSIS — E559 Vitamin D deficiency, unspecified: Secondary | ICD-10-CM | POA: Diagnosis not present

## 2023-12-02 DIAGNOSIS — Z681 Body mass index (BMI) 19 or less, adult: Secondary | ICD-10-CM | POA: Diagnosis not present

## 2023-12-02 DIAGNOSIS — G629 Polyneuropathy, unspecified: Secondary | ICD-10-CM | POA: Diagnosis not present

## 2023-12-02 DIAGNOSIS — R7303 Prediabetes: Secondary | ICD-10-CM | POA: Diagnosis not present

## 2023-12-05 ENCOUNTER — Telehealth: Payer: Self-pay | Admitting: Podiatry

## 2023-12-05 NOTE — Telephone Encounter (Signed)
 Patient called office after I contacted her this morning due to scheduling error via MyChart. Patient is wanting to schedule with Dr.Hyatt for a surgical consult. Per Telephone encounter from March of 2023, patient was sent a dismissal letter per Dr.Hyatt. She was seen back 11/29/2022 By Dr. Awanda and was seen for surgical consult by Dr. Silva per Dr.Dia recommendation. Please advise how to handle this going forward. Thanks

## 2023-12-05 NOTE — Telephone Encounter (Signed)
 Called and left message for patient to contact office to reschedule appointment scheduled via MyChart as she is established and was supposed to see Dr. Silva per Dr.Dia.

## 2023-12-06 DIAGNOSIS — Z79899 Other long term (current) drug therapy: Secondary | ICD-10-CM | POA: Diagnosis not present

## 2023-12-08 ENCOUNTER — Ambulatory Visit: Admitting: Podiatry

## 2023-12-09 NOTE — Telephone Encounter (Signed)
 Since pt was discharged by Dr. Verta, will not reschedule a visit with him.

## 2023-12-12 ENCOUNTER — Telehealth: Payer: Self-pay | Admitting: *Deleted

## 2023-12-12 NOTE — Telephone Encounter (Signed)
 Pt called in to cancel procedure wed 9/24 with Dr. Eartha. Reports has a fever and also is not a good time for her to schedule.

## 2023-12-13 ENCOUNTER — Ambulatory Visit: Admitting: Podiatry

## 2023-12-14 ENCOUNTER — Ambulatory Visit (HOSPITAL_COMMUNITY): Admit: 2023-12-14 | Admitting: Gastroenterology

## 2023-12-14 ENCOUNTER — Encounter (HOSPITAL_COMMUNITY): Payer: Self-pay

## 2023-12-14 SURGERY — COLONOSCOPY
Anesthesia: Choice

## 2023-12-26 ENCOUNTER — Ambulatory Visit: Admitting: Podiatry

## 2023-12-27 DIAGNOSIS — R5383 Other fatigue: Secondary | ICD-10-CM | POA: Diagnosis not present

## 2023-12-27 DIAGNOSIS — I7381 Erythromelalgia: Secondary | ICD-10-CM | POA: Diagnosis not present

## 2023-12-27 DIAGNOSIS — M1990 Unspecified osteoarthritis, unspecified site: Secondary | ICD-10-CM | POA: Diagnosis not present

## 2023-12-27 DIAGNOSIS — M797 Fibromyalgia: Secondary | ICD-10-CM | POA: Diagnosis not present

## 2023-12-27 DIAGNOSIS — M7989 Other specified soft tissue disorders: Secondary | ICD-10-CM | POA: Diagnosis not present

## 2023-12-27 DIAGNOSIS — M79643 Pain in unspecified hand: Secondary | ICD-10-CM | POA: Diagnosis not present

## 2023-12-27 DIAGNOSIS — M0579 Rheumatoid arthritis with rheumatoid factor of multiple sites without organ or systems involvement: Secondary | ICD-10-CM | POA: Diagnosis not present

## 2023-12-27 DIAGNOSIS — M79673 Pain in unspecified foot: Secondary | ICD-10-CM | POA: Diagnosis not present

## 2023-12-27 DIAGNOSIS — Z79899 Other long term (current) drug therapy: Secondary | ICD-10-CM | POA: Diagnosis not present

## 2023-12-27 DIAGNOSIS — M810 Age-related osteoporosis without current pathological fracture: Secondary | ICD-10-CM | POA: Diagnosis not present

## 2023-12-27 DIAGNOSIS — G8929 Other chronic pain: Secondary | ICD-10-CM | POA: Diagnosis not present

## 2023-12-30 DIAGNOSIS — Z681 Body mass index (BMI) 19 or less, adult: Secondary | ICD-10-CM | POA: Diagnosis not present

## 2023-12-30 DIAGNOSIS — Z79899 Other long term (current) drug therapy: Secondary | ICD-10-CM | POA: Diagnosis not present

## 2023-12-30 DIAGNOSIS — G629 Polyneuropathy, unspecified: Secondary | ICD-10-CM | POA: Diagnosis not present

## 2023-12-30 DIAGNOSIS — R0602 Shortness of breath: Secondary | ICD-10-CM | POA: Diagnosis not present

## 2023-12-30 DIAGNOSIS — K146 Glossodynia: Secondary | ICD-10-CM | POA: Diagnosis not present

## 2023-12-30 DIAGNOSIS — F17210 Nicotine dependence, cigarettes, uncomplicated: Secondary | ICD-10-CM | POA: Diagnosis not present

## 2023-12-30 DIAGNOSIS — M0579 Rheumatoid arthritis with rheumatoid factor of multiple sites without organ or systems involvement: Secondary | ICD-10-CM | POA: Diagnosis not present

## 2023-12-30 DIAGNOSIS — R7303 Prediabetes: Secondary | ICD-10-CM | POA: Diagnosis not present

## 2023-12-30 DIAGNOSIS — Z9181 History of falling: Secondary | ICD-10-CM | POA: Diagnosis not present

## 2023-12-30 DIAGNOSIS — M797 Fibromyalgia: Secondary | ICD-10-CM | POA: Diagnosis not present

## 2023-12-30 DIAGNOSIS — E559 Vitamin D deficiency, unspecified: Secondary | ICD-10-CM | POA: Diagnosis not present

## 2024-01-03 ENCOUNTER — Ambulatory Visit: Admitting: Orthopedic Surgery

## 2024-01-03 DIAGNOSIS — M205X2 Other deformities of toe(s) (acquired), left foot: Secondary | ICD-10-CM

## 2024-01-03 DIAGNOSIS — M7742 Metatarsalgia, left foot: Secondary | ICD-10-CM

## 2024-01-03 DIAGNOSIS — M79671 Pain in right foot: Secondary | ICD-10-CM | POA: Diagnosis not present

## 2024-01-03 DIAGNOSIS — M79672 Pain in left foot: Secondary | ICD-10-CM | POA: Diagnosis not present

## 2024-01-03 DIAGNOSIS — Z79899 Other long term (current) drug therapy: Secondary | ICD-10-CM | POA: Diagnosis not present

## 2024-01-04 ENCOUNTER — Encounter: Payer: Self-pay | Admitting: Orthopedic Surgery

## 2024-01-04 ENCOUNTER — Encounter (INDEPENDENT_AMBULATORY_CARE_PROVIDER_SITE_OTHER): Payer: Self-pay | Admitting: Gastroenterology

## 2024-01-04 NOTE — Progress Notes (Signed)
 Office Visit Note   Patient: Krista Harris           Date of Birth: October 14, 1955           MRN: [id number] Visit Date: 01/03/2024              Requested by: Shona Norleen PEDLAR, MD [address] [place],  [place] [address] PCP: Shona Norleen PEDLAR, MD  Chief Complaint  Patient presents with   Right Foot - Pain   Left Foot - Pain      HPI: Discussed the use of AI scribe software for clinical note transcription with the patient, who gave verbal consent to proceed.  History of Present Illness Krista Harris is a 68 year old female with extensive rheumatoid arthritis who presents with left foot metatarsalgia.  She experiences severe metatarsalgia in her left foot, specifically beneath the second, third, and fourth metatarsal heads, which keeps her bedridden most of the time.  Her history of extensive rheumatoid arthritis affects the MCP joints of her hands and the forefoot. Radiographs from last year showed elongated second, third, and fourth metatarsals in her left foot. She also has joint swelling of the fifth metatarsal head, described as 'little nodules', and a dislocated toe that has been present for two to three years.  She is currently taking methotrexate three times a week and has stopped taking prednisone . She quit smoking in June.     Assessment & Plan: Visit Diagnoses: No diagnosis found.  Plan: Assessment and Plan Assessment & Plan Left foot metatarsalgia with second, third, and fourth metatarsal head overload and associated joint effusion Chronic metatarsalgia with long metatarsals and joint effusion causing pain and limited mobility. - Schedule outpatient surgery at [facility] to shorten the second, third, and fourth metatarsals. - Instruct to use crutches and weight-bear on the heel post-surgery. - Advise minimizing weight-bearing on the foot for two weeks to facilitate healing. - Recommend using a kneeling scooter for mobility during recovery.  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 - Plan surgical correction of the second toe dislocation after recovery from the metatarsal surgery.  Non-displaced left talus fracture Non-displaced fracture of the left talus.  Rheumatoid arthritis Extensive rheumatoid arthritis involving the MCP joints of the hands and the forefoot, impacting mobility and quality of life. - Continue methotrexate therapy.      Follow-Up Instructions: No follow-ups on file.   Ortho Exam  Patient is alert, oriented, no adenopathy, well-dressed, normal affect, normal respiratory effort. Physical Exam EXTREMITIES: Triphasic dorsalis pedis pulse      Imaging: No results found. No images are attached to the encounter.  Labs: Lab Results  Component Value Date   ESRSEDRATE 19 05/06/2017   CRP <0.8 05/06/2017     Lab Results  Component Value Date   ALBUMIN 4.2 05/06/2017   ALBUMIN 4.2 03/23/2017   ALBUMIN 4.1 07/26/2016    No results found for: MG No results found for: VD25OH  No results found for: PREALBUMIN    Latest Ref Rng & Units 05/06/2017    2:03 PM 03/23/2017    3:42 PM 07/26/2016   11:34 AM  CBC EXTENDED  WBC 4.0 - 10.5 K/uL 5.0  6.8  6.6   RBC 3.87 - 5.11 MIL/uL 4.24  4.34  4.32   Hemoglobin 12.0 - 15.0 g/dL 86.2  85.6  85.7   HCT 36.0 - 46.0 % 43.1  45.0  43.3   Platelets 150 - 400 K/uL 270  331  304   NEUT# 1.7 - 7.7 K/uL   3.5   Lymph# 0.7 - 4.0 K/uL   2.6      There is no height or weight on file to calculate BMI.  Orders:  No orders of the defined types were placed in this encounter.  No orders of the defined types were placed in this encounter.    Procedures: No procedures performed  Clinical Data: No additional findings.  ROS:  All other systems negative, except as noted in the HPI. Review of Systems  Objective: Vital Signs: There were no vitals taken for this visit.  Specialty  Comments:  No specialty comments available.  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   Lumbar back pain with radiculopathy affecting left lower extremity 05/02/2020   Disorder of breast 04/17/2019   Malaise and fatigue 04/17/2019   Mood changes 04/17/2019   Rheumatoid arthritis (HCC) 04/17/2019   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   Dry mouth 11/10/2017   Irritable bowel syndrome 11/04/2017   Primary fibromyalgia syndrome 11/04/2017   Diarrhea 04/05/2017   History of colonic polyps 04/05/2017   Attention deficit hyperactivity disorder, 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   Social phobia 04/08/2014   Past Medical History:  Diagnosis Date   BMS (burning mouth syndrome)    Erythromelalgia    Fibromyalgia    Rheumatoid arthritis (HCC)     Family History  Problem Relation Age of Onset   High blood pressure Mother    High Cholesterol Mother    Congestive Heart Failure Father     Past Surgical History:  Procedure Laterality Date   AUGMENTATION MAMMAPLASTY     breast implants now removed   CARPOMETACARPEL SUSPENSION PLASTY Left 03/05/2021   Procedure: LEFT THUMB TRAPEZIECTOMY AND SUSPENSION PLASTY;  Surgeon: Murrell Drivers, MD;  Location: [facility];  Service: Orthopedics;  Laterality: Left;  Regional block   FOOT SURGERY Bilateral    for rheumatoid arthritis   HERNIA REPAIR     WRIST SURGERY     Social History   Occupational History   Not on file  Tobacco Use   Smoking status: Every Day    Current packs/day: 0.50     Types: Cigarettes   Smokeless tobacco: Never  Vaping Use   Vaping status: Never Used  Substance and Sexual Activity   Alcohol use: No   Drug use: No   Sexual activity: Not on file

## 2024-01-18 ENCOUNTER — Other Ambulatory Visit: Payer: Self-pay

## 2024-01-18 ENCOUNTER — Encounter (HOSPITAL_COMMUNITY): Payer: Self-pay | Admitting: Orthopedic Surgery

## 2024-01-18 NOTE — H&P (Signed)
 Krista Harris is an 68 y.o. female.   Chief Complaint: left foot pain that interferes with daily living HPI:  Krista Harris is a 68 year old female with extensive rheumatoid arthritis who presents with left foot metatarsalgia.   She experiences severe metatarsalgia in her left foot, specifically beneath the second, third, and fourth metatarsal heads, which keeps her bedridden most of the time.   Her history of extensive rheumatoid arthritis affects the MCP joints of her hands and the forefoot. Radiographs from last year showed elongated second, third, and fourth metatarsals in her left foot. She also has joint swelling of the fifth metatarsal head, described as 'little nodules', and a dislocated toe that has been present for two to three years.   She is currently taking methotrexate three times a week and has stopped taking prednisone . She quit smoking in June.  Past Medical History:  Diagnosis Date   BMS (burning mouth syndrome)    Erythromelalgia    Fibromyalgia    Rheumatoid arthritis (HCC)     Past Surgical History:  Procedure Laterality Date   AUGMENTATION MAMMAPLASTY     breast implants now removed   CARPOMETACARPEL SUSPENSION PLASTY Left 03/05/2021   Procedure: LEFT THUMB TRAPEZIECTOMY AND SUSPENSION PLASTY;  Surgeon: Murrell Drivers, MD;  Location: [facility];  Service: Orthopedics;  Laterality: Left;  Regional block   FOOT SURGERY Bilateral    for rheumatoid arthritis   HERNIA REPAIR     WRIST SURGERY      Family History  Problem Relation Age of Onset   High blood pressure Mother    High Cholesterol Mother    Congestive Heart Failure Father    Social History:  reports that she has been smoking cigarettes. She has never used smokeless tobacco. She reports that she does not drink alcohol and does not use drugs.  Allergies:  Allergies  Allergen Reactions   Adalimumab Hives   Infliximab Hives   Milnacipran Other (See Comments)     mood  changes   Other     Humera; hives; Allergy; 06/12/2010  Savella; mood changes; Unspecified; 06/12/2010   Pregabalin Rash    No medications prior to admission.    No results found for this or any previous visit (from the past 48 hours). No results found.  Review of Systems  All other systems reviewed and are negative.   There were no vitals taken for this visit. Physical Exam  Patient is alert  Oriented  no adenopathy, well-dressed  normal affect  normal respiratory effort.  Examination patient has a good dorsalis pedis pulse bilaterally.  She has swelling beneath the second and third metatarsal heads bilaterally and tenderness to palpation bilaterally beneath the second and third metatarsal heads.  Patient states he is on prednisone  methotrexate and a disease modifying drug. Left foot second toe chronic dislocation Chronic dislocation of the left foot second toe, severe and impacting mobility.   Assessment/Plan Left foot metatarsalgia with second, third, and fourth metatarsal head overload and associated joint effusion Chronic metatarsalgia with long metatarsals and joint effusion causing pain and limited mobility. - Schedule outpatient surgery at [facility] to shorten the second, third, and fourth metatarsals. - Instruct to use crutches and weight-bear on the heel post-surgery. - Advise minimizing weight-bearing on the foot for two weeks to facilitate healing. - Recommend using a kneeling scooter for mobility during recovery.   Left foot second toe chronic dislocation Chronic dislocation of the left foot second toe, severe  and impacting mobility. - Plan surgical correction of the second toe dislocation after recovery from the metatarsal surgery.   Non-displaced left talus fracture Non-displaced fracture of the left talus.   Rheumatoid arthritis Extensive rheumatoid arthritis involving the MCP joints of the hands and the forefoot, impacting mobility and quality of  life. - Continue methotrexate therapy.  Maurilio Deland Collet, PA-C 01/18/2024, 1:57 PM

## 2024-01-18 NOTE — Progress Notes (Signed)
 PCP - Dr Norleen Hurst Cardiologist - none  Chest x-ray - n/a EKG - n/a Stress Test - 10/17/19 ECHO - 10/17/19 Cardiac Cath - n/a  ICD Pacemaker/Loop - n/a  Sleep Study -  n/a  Diabetes - n/a  Methotrexate Instructions:  Last dose was on 01/12/24.  On hold for upcoming procedure.   Aspirin Instructions: Last dose was this past month.  On hold for upcoming procedure.  ERAS - clear liquids til 0430 DOS  Anesthesia review: no  STOP now taking any Aspirin (unless otherwise instructed by your surgeon), Aleve, Naproxen, Ibuprofen, Motrin, Advil, Goody's, BC's, all herbal medications, fish oil, and all vitamins.   Coronavirus Screening Do you have any of the following symptoms:  Cough yes/no: No Fever (>100.6F)  yes/no: No Runny nose yes/no: No Sore throat yes/no: No Difficulty breathing/shortness of breath  yes/no: No  Have you traveled in the last 14 days and where? yes/no: No  Patient verbalized understanding of instructions that were given via phone.

## 2024-01-19 ENCOUNTER — Ambulatory Visit: Admitting: Orthopedic Surgery

## 2024-01-20 ENCOUNTER — Ambulatory Visit (HOSPITAL_COMMUNITY): Admitting: Anesthesiology

## 2024-01-20 ENCOUNTER — Other Ambulatory Visit (HOSPITAL_COMMUNITY): Payer: Self-pay

## 2024-01-20 ENCOUNTER — Ambulatory Visit (HOSPITAL_COMMUNITY)

## 2024-01-20 ENCOUNTER — Other Ambulatory Visit: Payer: Self-pay

## 2024-01-20 ENCOUNTER — Encounter (HOSPITAL_COMMUNITY): Admission: RE | Disposition: A | Payer: Self-pay | Source: Home / Self Care | Attending: Orthopedic Surgery

## 2024-01-20 ENCOUNTER — Ambulatory Visit (HOSPITAL_COMMUNITY)
Admission: RE | Admit: 2024-01-20 | Discharge: 2024-01-20 | Disposition: A | Discharge status: Home / Self Care | Attending: Orthopedic Surgery | Admitting: Orthopedic Surgery

## 2024-01-20 DIAGNOSIS — M79675 Pain in left toe(s): Secondary | ICD-10-CM

## 2024-01-20 DIAGNOSIS — M797 Fibromyalgia: Secondary | ICD-10-CM | POA: Insufficient documentation

## 2024-01-20 DIAGNOSIS — M7742 Metatarsalgia, left foot: Secondary | ICD-10-CM

## 2024-01-20 DIAGNOSIS — Z87891 Personal history of nicotine dependence: Secondary | ICD-10-CM | POA: Diagnosis not present

## 2024-01-20 DIAGNOSIS — M1990 Unspecified osteoarthritis, unspecified site: Secondary | ICD-10-CM | POA: Insufficient documentation

## 2024-01-20 HISTORY — PX: WEIL OSTEOTOMY: SHX5044

## 2024-01-20 LAB — POCT I-STAT, CHEM 8
BUN: 19 mg/dL (ref 8–23)
Calcium, Ion: 1.24 mmol/L (ref 1.15–1.40)
Chloride: 106 mmol/L (ref 98–111)
Creatinine, Ser: 0.6 mg/dL (ref 0.44–1.00)
Glucose, Bld: 87 mg/dL (ref 70–99)
HCT: 41 % (ref 36.0–46.0)
Hemoglobin: 13.9 g/dL (ref 12.0–15.0)
Potassium: 4.3 mmol/L (ref 3.5–5.1)
Sodium: 140 mmol/L (ref 135–145)
TCO2: 24 mmol/L (ref 22–32)

## 2024-01-20 SURGERY — OSTEOTOMY, WEIL
Anesthesia: Regional | Site: Foot | Laterality: Left

## 2024-01-20 MED ORDER — CHLORHEXIDINE GLUCONATE 0.12 % MT SOLN
15.0000 mL | Freq: Once | OROMUCOSAL | Status: AC
  Administered 2024-01-20: 15 mL via OROMUCOSAL
  Filled 2024-01-20: qty 15

## 2024-01-20 MED ORDER — AMISULPRIDE (ANTIEMETIC) 5 MG/2ML IV SOLN: 10.0000 mg | Freq: Once | INTRAVENOUS | Status: DC | PRN

## 2024-01-20 MED ORDER — MIDAZOLAM HCL (PF) 2 MG/2ML IJ SOLN
INTRAMUSCULAR | Status: DC | PRN
  Administered 2024-01-20: 2 mg via INTRAVENOUS

## 2024-01-20 MED ORDER — PHENYLEPHRINE 80 MCG/ML (10ML) SYRINGE FOR IV PUSH (FOR BLOOD PRESSURE SUPPORT)
PREFILLED_SYRINGE | INTRAVENOUS | Status: DC | PRN
  Administered 2024-01-20: 80 ug via INTRAVENOUS
  Administered 2024-01-20 (×2): 120 ug via INTRAVENOUS
  Administered 2024-01-20: 80 ug via INTRAVENOUS

## 2024-01-20 MED ORDER — 0.9 % SODIUM CHLORIDE (POUR BTL) OPTIME
TOPICAL | Status: DC | PRN
  Administered 2024-01-20: 1000 mL

## 2024-01-20 MED ORDER — LACTATED RINGERS IV SOLN
INTRAVENOUS | Status: DC
Start: 2024-01-20 — End: 2024-01-20

## 2024-01-20 MED ORDER — ACETAMINOPHEN 10 MG/ML IV SOLN: 700.0000 mg | Freq: Once | INTRAVENOUS | Status: DC | PRN

## 2024-01-20 MED ORDER — ONDANSETRON HCL 4 MG/2ML IJ SOLN
INTRAMUSCULAR | Status: DC | PRN
  Administered 2024-01-20: 4 mg via INTRAVENOUS

## 2024-01-20 MED ORDER — PROPOFOL 10 MG/ML IV BOLUS
INTRAVENOUS | Status: DC | PRN
  Administered 2024-01-20: 180 ug/kg/min via INTRAVENOUS
  Administered 2024-01-20: 15 mg via INTRAVENOUS

## 2024-01-20 MED ORDER — FENTANYL CITRATE (PF) 100 MCG/2ML IJ SOLN: 25.0000 ug | INTRAMUSCULAR | Status: DC | PRN

## 2024-01-20 MED ORDER — FENTANYL CITRATE (PF) 100 MCG/2ML IJ SOLN
INTRAMUSCULAR | Status: DC | PRN
  Administered 2024-01-20: 100 ug via INTRAVENOUS

## 2024-01-20 MED ORDER — VASHE WOUND IRRIGATION OPTIME
TOPICAL | Status: DC | PRN
  Administered 2024-01-20: 34 [oz_av]

## 2024-01-20 MED ORDER — BUPIVACAINE-EPINEPHRINE (PF) 0.5% -1:200000 IJ SOLN
INTRAMUSCULAR | Status: DC | PRN
  Administered 2024-01-20: 25 mL via PERINEURAL

## 2024-01-20 MED ORDER — DEXAMETHASONE SOD PHOSPHATE PF 10 MG/ML IJ SOLN
INTRAMUSCULAR | Status: DC | PRN
  Administered 2024-01-20: 5 mg via INTRAVENOUS

## 2024-01-20 MED ORDER — ORAL CARE MOUTH RINSE: 15.0000 mL | Freq: Once | OROMUCOSAL | Status: AC

## 2024-01-20 MED ORDER — OXYCODONE-ACETAMINOPHEN 5-325 MG PO TABS
1.0000 | ORAL_TABLET | ORAL | 0 refills | Status: AC | PRN
  Filled 2024-01-20: qty 30, 7d supply, fill #0

## 2024-01-20 MED ORDER — ONDANSETRON HCL 4 MG/2ML IJ SOLN: 4.0000 mg | Freq: Once | INTRAMUSCULAR | Status: DC | PRN

## 2024-01-20 MED ORDER — CEFAZOLIN SODIUM-DEXTROSE 2-4 GM/100ML-% IV SOLN
2.0000 g | INTRAVENOUS | Status: AC
Start: 2024-01-20 — End: 2024-01-20
  Administered 2024-01-20: 2 g via INTRAVENOUS
  Filled 2024-01-20: qty 100

## 2024-01-20 MED ORDER — MIDAZOLAM HCL 2 MG/2ML IJ SOLN
INTRAMUSCULAR | Status: AC
  Filled 2024-01-20: qty 2

## 2024-01-20 MED ORDER — FENTANYL CITRATE (PF) 100 MCG/2ML IJ SOLN
INTRAMUSCULAR | Status: AC
  Filled 2024-01-20: qty 2

## 2024-01-20 MED ORDER — PROPOFOL 10 MG/ML IV BOLUS
INTRAVENOUS | Status: AC
  Filled 2024-01-20: qty 20

## 2024-01-20 SURGICAL SUPPLY — 43 items
BAG COUNTER SPONGE SURGICOUNT (BAG) ×1 IMPLANT
BIT DRILL LONG 1.5 ZI (BIT) IMPLANT
BLADE AVERAGE 25X9 (BLADE) IMPLANT
BLADE MINI RND TIP GREEN BEAV (BLADE) IMPLANT
BNDG COHESIVE 1X5 TAN STRL LF (GAUZE/BANDAGES/DRESSINGS) IMPLANT
BNDG COHESIVE 6X5 TAN ST LF (GAUZE/BANDAGES/DRESSINGS) IMPLANT
BNDG COMPR ESMARK 4X3 LF (GAUZE/BANDAGES/DRESSINGS) ×1 IMPLANT
BNDG GAUZE DERMACEA FLUFF 4 (GAUZE/BANDAGES/DRESSINGS) IMPLANT
CORD BIPOLAR FORCEPS 12FT (ELECTRODE) ×1 IMPLANT
COVER SURGICAL LIGHT HANDLE (MISCELLANEOUS) ×2 IMPLANT
CUFF TOURN SGL QUICK 18X4 (TOURNIQUET CUFF) IMPLANT
CUFF TRNQT CYL 24X4X16.5-23 (TOURNIQUET CUFF) IMPLANT
DRAPE OEC MINIVIEW 54X84 (DRAPES) IMPLANT
DRAPE U-SHAPE 47X51 STRL (DRAPES) ×1 IMPLANT
DRIVER RETENTION T6 ZI (ORTHOPEDIC DISPOSABLE SUPPLIES) IMPLANT
DRSG ADAPTIC 3X8 NADH LF (GAUZE/BANDAGES/DRESSINGS) IMPLANT
DRSG EMULSION OIL 3X3 NADH (GAUZE/BANDAGES/DRESSINGS) IMPLANT
DURAPREP 26ML APPLICATOR (WOUND CARE) ×1 IMPLANT
ELECTRODE REM PT RTRN 9FT ADLT (ELECTROSURGICAL) ×1 IMPLANT
GAUZE PAD ABD 8X10 STRL (GAUZE/BANDAGES/DRESSINGS) IMPLANT
GAUZE SPONGE 4X4 12PLY STRL (GAUZE/BANDAGES/DRESSINGS) IMPLANT
GLOVE BIOGEL PI IND STRL 9 (GLOVE) ×1 IMPLANT
GLOVE SURG ORTHO 9.0 STRL STRW (GLOVE) ×1 IMPLANT
GOWN STRL REUS W/ TWL XL LVL3 (GOWN DISPOSABLE) ×2 IMPLANT
KIT BASIN OR (CUSTOM PROCEDURE TRAY) ×1 IMPLANT
KIT TURNOVER KIT B (KITS) ×1 IMPLANT
MANIFOLD NEPTUNE II (INSTRUMENTS) ×1 IMPLANT
NDL HYPO 25GX1X1/2 BEV (NEEDLE) IMPLANT
NEEDLE HYPO 25GX1X1/2 BEV (NEEDLE) IMPLANT
PACK ORTHO EXTREMITY (CUSTOM PROCEDURE TRAY) ×1 IMPLANT
PAD ARMBOARD POSITIONER FOAM (MISCELLANEOUS) ×2 IMPLANT
PAD CAST 4YDX4 CTTN HI CHSV (CAST SUPPLIES) IMPLANT
SCREW NLOCK 2X10 (Screw) IMPLANT
SCREW NLOCK 2X12 (Screw) IMPLANT
SOLN 0.9% NACL POUR BTL 1000ML (IV SOLUTION) ×1 IMPLANT
SOLN STERILE WATER BTL 1000 ML (IV SOLUTION) ×1 IMPLANT
SUCTION TUBE FRAZIER 10FR DISP (SUCTIONS) IMPLANT
SUT ETHILON 2 0 FS 18 (SUTURE) IMPLANT
SUT VIC AB 2-0 FS1 27 (SUTURE) IMPLANT
SYR CONTROL 10ML LL (SYRINGE) IMPLANT
TOWEL GREEN STERILE (TOWEL DISPOSABLE) ×1 IMPLANT
TOWEL GREEN STERILE FF (TOWEL DISPOSABLE) ×1 IMPLANT
TUBE CONNECTING 12X1/4 (SUCTIONS) IMPLANT

## 2024-01-20 NOTE — Anesthesia Preprocedure Evaluation (Addendum)
 Anesthesia Evaluation  Patient identified by MRN, date of birth, ID band Patient awake    Reviewed: Allergy & Precautions, NPO status , Patient's Chart, lab work & pertinent test results  Airway Mallampati: II       Dental no notable dental hx.    Pulmonary former smoker   Pulmonary exam normal        Cardiovascular negative cardio ROS Normal cardiovascular exam     Neuro/Psych  PSYCHIATRIC DISORDERS Anxiety      Neuromuscular disease    GI/Hepatic negative GI ROS,,,(+)     substance abuse    Endo/Other  negative endocrine ROS    Renal/GU negative Renal ROS     Musculoskeletal  (+) Arthritis ,  Fibromyalgia -, narcotic dependent  Abdominal   Peds  Hematology negative hematology ROS (+)   Anesthesia Other Findings Metatarsalgia Left Foot  Reproductive/Obstetrics                              Anesthesia Physical Anesthesia Plan  ASA: 2  Anesthesia Plan: Regional   Post-op Pain Management:    Induction: Intravenous  PONV Risk Score and Plan: 2 and Ondansetron , Dexamethasone , Propofol  infusion, Midazolam  and Treatment may vary due to age or medical condition  Airway Management Planned: Simple Face Mask  Additional Equipment:   Intra-op Plan:   Post-operative Plan:   Informed Consent: I have reviewed the patients History and Physical, chart, labs and discussed the procedure including the risks, benefits and alternatives for the proposed anesthesia with the patient or authorized representative who has indicated his/her understanding and acceptance.     Dental advisory given  Plan Discussed with: CRNA  Anesthesia Plan Comments:         Anesthesia Quick Evaluation

## 2024-01-20 NOTE — Op Note (Signed)
 01/20/2024  8:15 AM  PATIENT:  Krista Harris    PRE-OPERATIVE DIAGNOSIS:  Metatarsalgia Left Foot 2nd, 3rd and 4th metatarsals.  POST-OPERATIVE DIAGNOSIS:  Same  PROCEDURE: Weil osteotomy left foot 2nd, 3rd and 4th metatarsals  SURGEON:  Jerona LULLA Sage, MD  PHYSICIAN ASSISTANT:None ANESTHESIA:   General  PREOPERATIVE INDICATIONS:  Krista Harris is a  68 y.o. female with a diagnosis of Metatarsalgia Left Foot who failed conservative measures and elected for surgical management.    The risks benefits and alternatives were discussed with the patient preoperatively including but not limited to the risks of infection, bleeding, nerve injury, cardiopulmonary complications, the need for revision surgery, among others, and the patient was willing to proceed.  OPERATIVE IMPLANTS:   Implant Name Type Inv. Item Serial No. Manufacturer Lot No. LRB No. Used Action  SCREW NLOCK 2X12 - [id number] Screw SCREW NLOCK 2X12  [employer](ORTH,TRAU,BIO,SG)  Left 1 Implanted  SCREW NLOCK 2X10 - [id number] Screw SCREW NLOCK 2X10  [employer](ORTH,TRAU,BIO,SG)  Left 2 Implanted    @ENCIMAGES @  OPERATIVE FINDINGS: Patient had decreased clawing of toes 2 3 and 4 after the osteotomies.  OPERATIVE PROCEDURE: Patient brought the operating room after undergoing a regional anesthetic.  After adequate levels anesthesia were obtained patient's left lower extremity was prepped using DuraPrep draped into a sterile field a timeout was called.  A dorsal incision was made centered over the third metatarsal.  Blunt dissection was carried down to the second MTP joint.  The capsule was incised the extensor tendons were protected retractors were placed and a Weil osteotomy was performed.  The metatarsal head was translated proximally overhanging bone which was resected and the osteotomy was secured with a 12 mm x 2 mm mini frag screw.  Attention was then focused on the third metatarsal.  The capsule was incised  the extensor tendons were protected and a Weil osteotomy was performed on the third metatarsal.  Metatarsal head was translated proximally and overhanging bone was resected and this was stabilized by 2 x 10 mm screw.  Attention was then focused on the fourth metatarsal.  The capsule was incised the extensor tendons were protected and a Weil osteotomy was performed on the fourth metatarsal.  The metatarsal head was translated proximally and secured with a 2 x 10 mm mini frag screw.  The wound was irrigated with normal saline incision closed using 2-0 nylon a sterile dressing was applied patient was taken the PACU in stable condition   DISCHARGE PLANNING:  Antibiotic duration: Preoperative antibiotics  Weightbearing: Touchdown weightbearing on the left  Pain medication: Prescription for Percocet  Dressing care/ Wound VAC: Dry dressing reinforce as needed  Ambulatory devices: Crutches, walker, kneeling scooter.  Discharge to: Home.  Follow-up: In the office 1 week post operative.

## 2024-01-20 NOTE — Transfer of Care (Signed)
 Immediate Anesthesia Transfer of Care Note  Patient: Krista Harris  Procedure(s) Performed: OSTEOTOMY, WEIL LEFT TWO, THREE, FOUR (Left: Foot)  Patient Location: PACU  Anesthesia Type:MAC  Level of Consciousness: awake and alert   Airway & Oxygen Therapy: Patient Spontanous Breathing  Post-op Assessment: Report given to RN  Post vital signs: Reviewed and stable  Last Vitals:  Vitals Value Taken Time  BP 87/55 01/20/24 08:23  Temp    Pulse 71 01/20/24 08:26  Resp 21 01/20/24 08:26  SpO2 98 % 01/20/24 08:26  Vitals shown include unfiled device data.  Last Pain:  Vitals:   01/20/24 9367  PainSc: 6          Complications: No notable events documented.

## 2024-01-20 NOTE — Discharge Instructions (Signed)
 Wear post op shoe to protect the left foot, touch down weight bearing on the left foot with crutches or walker for safety.  Elevate your feet above your heat to decrease swelling and pain.

## 2024-01-20 NOTE — Anesthesia Procedure Notes (Signed)
 Anesthesia Regional Block: Popliteal block   Pre-Anesthetic Checklist: , timeout performed,  Correct Patient, Correct Site, Correct Laterality,  Correct Procedure, Correct Position, site marked,  Risks and benefits discussed,  Surgical consent,  Pre-op evaluation,  At surgeon's request and post-op pain management  Laterality: Left  Prep: chloraprep       Needles:  Injection technique: Single-shot  Needle Type: Echogenic Stimulator Needle     Needle Length: 9cm  Needle Gauge: 21     Additional Needles:   Procedures:,,,, ultrasound used (permanent image in chart),,    Narrative:  Start time: 01/20/2024 7:15 AM End time: 01/20/2024 7:25 AM Injection made incrementally with aspirations every 5 mL.  Performed by: Personally  Anesthesiologist: Patrisha Bernardino SQUIBB, MD  Additional Notes: Functioning IV was confirmed and monitors were applied.  A timeout was performed. Sterile prep, hand hygiene and sterile gloves were used. A 90mm 21ga Arrow echogenic stimulator needle was used. Negative aspiration and negative test dose prior to incremental administration of local anesthetic. The patient tolerated the procedure well.  Ultrasound guidance: relevent anatomy identified, needle position confirmed, local anesthetic spread visualized around nerve(s), vascular puncture avoided.  Image printed for medical record.

## 2024-01-20 NOTE — Progress Notes (Signed)
 Pt does not have a ride; states that she cannot reach her friend Alm this am. Per dr Harden pt will stay overnight

## 2024-01-20 NOTE — Interval H&P Note (Signed)
 History and Physical Interval Note:  01/20/2024 6:37 AM  Krista Harris  has presented today for surgery, with the diagnosis of Metatarsalgia Left Foot.  The various methods of treatment have been discussed with the patient and family. After consideration of risks, benefits and other options for treatment, the patient has consented to  Procedure(s) with comments: OSTEOTOMY, WEIL (Left) - LEFT 2, 3, 4 WEIL OSTEOTOMY as a surgical intervention.  The patient's history has been reviewed, patient examined, no change in status, stable for surgery.  I have reviewed the patient's chart and labs.  Questions were answered to the patient's satisfaction.     [name] V Devereaux Grayson

## 2024-01-21 ENCOUNTER — Encounter (HOSPITAL_COMMUNITY): Payer: Self-pay | Admitting: Orthopedic Surgery

## 2024-01-23 ENCOUNTER — Encounter: Payer: Self-pay | Admitting: Radiology

## 2024-01-23 NOTE — Anesthesia Postprocedure Evaluation (Signed)
 Anesthesia Post Note  Patient: Krista Harris  Procedure(s) Performed: OSTEOTOMY, WEIL LEFT TWO, THREE, FOUR (Left: Foot)     Patient location during evaluation: PACU Anesthesia Type: Regional Level of consciousness: awake Pain management: pain level controlled Vital Signs Assessment: post-procedure vital signs reviewed and stable Respiratory status: spontaneous breathing, nonlabored ventilation and respiratory function stable Cardiovascular status: blood pressure returned to baseline and stable Postop Assessment: no apparent nausea or vomiting Anesthetic complications: no   No notable events documented.  Last Vitals:  Vitals:   01/20/24 0845 01/20/24 0900  BP: 93/61 107/69  Pulse: 73 66  Resp: 15 18  Temp:  36.5 C  SpO2: 100% 100%    Last Pain:  Vitals:   01/20/24 0900  PainSc: 0-No pain                 Shelie Lansing P Jamal Haskin

## 2024-01-27 ENCOUNTER — Telehealth: Payer: Self-pay

## 2024-01-27 NOTE — Telephone Encounter (Signed)
 I called pt and discussed with her instructions that she was given at her office visit in prep for surgery. Non wtb on the foot for several weeks. Ambulatory aid to keep weight off foot. Elevation. Pt has an appt on Monday and will call with any other questions.

## 2024-01-27 NOTE — Telephone Encounter (Signed)
 Patient left a long VM. Basically stating that she was given discharge paperwork and it does not have much info. She would like someone to call her with more detailed instructions. She would like to know what can and can't she do. Does she need to elevate leg? Does she use crutches? WB status? How much activity?  DOS: 01/20/2024- OSTEOTOMY, WEIL LEFT TWO, THREE, FOUR       CB [phone]

## 2024-01-30 ENCOUNTER — Encounter: Payer: Self-pay | Admitting: Physician Assistant

## 2024-01-30 ENCOUNTER — Ambulatory Visit (INDEPENDENT_AMBULATORY_CARE_PROVIDER_SITE_OTHER): Admitting: Physician Assistant

## 2024-01-30 DIAGNOSIS — M7742 Metatarsalgia, left foot: Secondary | ICD-10-CM

## 2024-01-30 NOTE — Progress Notes (Signed)
 Office Visit Note   Patient: Krista Harris           Date of Birth: 18-May-1955           MRN: [id number] Visit Date: 01/30/2024              Requested by: Shona Norleen PEDLAR, MD [address] [place],  [place] [address] PCP: Shona Norleen PEDLAR, MD  Chief Complaint  Patient presents with   Left Foot - Routine Post Op      HPI:  Krista Harris is a  68 y.o. female with a diagnosis of Metatarsalgia Left Foot who failed conservative measures and elected for surgical management.  She is now s/p  Weil osteotomy left foot 2nd, 3rd and 4th metatarsals.    She was discharged from the hospital with a vac dressing.  Once the skin heals she can bear weight on her foot in a post op shoe.    She was very upset with her foot and that the incision was on the dorsum of her foot and not the bottom of her foot.  She states she did not think doing the weil surgery was going to help and she still has swelling on the bottom of her foot.  She states she has a bursa on the plantar foot that needs to be removed.    Assessment & Plan: Visit Diagnoses: No diagnosis found.  Plan: Advise minimizing weight-bearing on the left foot for two weeks to facilitate healing.  TDWB verses heel weight bearing.  Elevate the foot above the heart multiple times a day. She walked out carrying her crutches and putting full weight on her left foot in the post op shoe.    Follow-Up Instructions: Return in about 2 weeks (around 02/13/2024) for suture removal and x ray to check for healing.   Ortho Exam  Patient is alert, oriented, no adenopathy, well-dressed, normal affect, normal respiratory effort. Palpable DP pulse, minimal edema surrounding the incision, no cellulitis or drainage.  Sutures intact.  Plantar fullness over the ball of the foot she states this has not changed in many years.      Imaging: No results found. No images are attached to the encounter.  Labs: Lab Results  Component Value Date   ESRSEDRATE  19 05/06/2017   CRP <0.8 05/06/2017     Lab Results  Component Value Date   ALBUMIN 4.2 05/06/2017   ALBUMIN 4.2 03/23/2017   ALBUMIN 4.1 07/26/2016    No results found for: MG No results found for: VD25OH  No results found for: PREALBUMIN    Latest Ref Rng & Units 01/20/2024    7:09 AM 05/06/2017    2:03 PM 03/23/2017    3:42 PM  CBC EXTENDED  WBC 4.0 - 10.5 K/uL  5.0  6.8   RBC 3.87 - 5.11 MIL/uL  4.24  4.34   Hemoglobin 12.0 - 15.0 g/dL 86.0  86.2  85.6   HCT 36.0 - 46.0 % 41.0  43.1  45.0   Platelets 150 - 400 K/uL  270  331      There is no height or weight on file to calculate BMI.  Orders:  No orders of the defined types were placed in this encounter.  No orders of the defined types were placed in this encounter.    Procedures: No procedures performed  Clinical Data: No additional findings.  ROS:  All other systems negative, except as noted in the HPI.  Review of Systems  Objective: Vital Signs: There were no vitals taken for this visit.  Specialty Comments:  No specialty comments available.  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   Lumbar back pain with radiculopathy affecting left lower extremity 05/02/2020   Disorder of breast 04/17/2019   Malaise and fatigue 04/17/2019   Mood changes 04/17/2019   Rheumatoid arthritis (HCC) 04/17/2019   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   Dry mouth 11/10/2017   Irritable bowel syndrome 11/04/2017   Primary fibromyalgia syndrome 11/04/2017   Diarrhea 04/05/2017   History of colonic polyps 04/05/2017    Attention deficit hyperactivity disorder, 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   Social phobia 04/08/2014   Past Medical History:  Diagnosis Date   ADHD (attention deficit hyperactivity disorder) 11/04/2017   BMS (burning mouth syndrome)    klonopin   Erythromelalgia    asa   Fibromyalgia 11/04/2017   IBS (irritable bowel syndrome) 11/04/2017   hx - no current problems , no meds   Rheumatoid arthritis (HCC)     Family History  Problem Relation Age of Onset   High blood pressure Mother    High Cholesterol Mother    Congestive Heart Failure Father     Past Surgical History:  Procedure Laterality Date   AUGMENTATION MAMMAPLASTY     breast implants now removed   CARPOMETACARPEL SUSPENSION PLASTY Left 03/05/2021   Procedure: LEFT THUMB TRAPEZIECTOMY AND SUSPENSION PLASTY;  Surgeon: Murrell Drivers, MD;  Location: [facility];  Service: Orthopedics;  Laterality: Left;  Regional block   EYE SURGERY Bilateral 2024   cataracts   FOOT SURGERY Bilateral    for rheumatoid arthritis   FOOT SURGERY     x 2 - 2010 Left & 2011 right   HERNIA REPAIR     WEIL OSTEOTOMY Left 01/20/2024   Procedure: OSTEOTOMY, WEIL LEFT TWO, THREE, FOUR;  Surgeon: Harden Jerona GAILS, MD;  Location: [facility];  Service: Orthopedics;  Laterality: Left;  LEFT 2, 3, 4 WEIL OSTEOTOMY   WRIST SURGERY Left    Social History   Occupational History   Not on file  Tobacco Use   Smoking status: Former    Types: Cigarettes   Smokeless tobacco: Never   Tobacco comments:    Quit smoking 08/2023 - on chantix   Vaping Use   Vaping status: Never Used  Substance and Sexual Activity   Alcohol use: No   Drug use: No   Sexual activity: Not Currently    Birth control/protection: Post-menopausal

## 2024-01-31 ENCOUNTER — Encounter: Admitting: Family

## 2024-02-02 ENCOUNTER — Telehealth: Payer: Self-pay

## 2024-02-02 ENCOUNTER — Encounter: Payer: Self-pay | Admitting: Orthopedic Surgery

## 2024-02-02 NOTE — Telephone Encounter (Signed)
 Dr. Harden called and sw pt to address this message

## 2024-02-14 ENCOUNTER — Telehealth: Payer: Self-pay | Admitting: Orthopedic Surgery

## 2024-02-14 NOTE — Telephone Encounter (Signed)
 Called and lm on vm to advise as of last note 01/30/2024 was to follow up for stitch removal and xray. Should have stopped at front desk to make appt. I advised she can call and we can make an appt for her to come in next week.

## 2024-02-14 NOTE — Telephone Encounter (Signed)
 Pt called wanting to know when she needs to come back and get her stiches out. She said she hasn't heard anything concerning this. Her surgery was on 10/31. Call back number is [phone]

## 2024-02-22 ENCOUNTER — Other Ambulatory Visit: Payer: Self-pay

## 2024-02-22 ENCOUNTER — Ambulatory Visit: Admitting: Family

## 2024-02-22 ENCOUNTER — Encounter: Payer: Self-pay | Admitting: Family

## 2024-02-22 ENCOUNTER — Telehealth: Payer: Self-pay | Admitting: Orthopedic Surgery

## 2024-02-22 DIAGNOSIS — M205X2 Other deformities of toe(s) (acquired), left foot: Secondary | ICD-10-CM

## 2024-02-22 DIAGNOSIS — M79672 Pain in left foot: Secondary | ICD-10-CM

## 2024-02-22 DIAGNOSIS — M7742 Metatarsalgia, left foot: Secondary | ICD-10-CM

## 2024-02-22 DIAGNOSIS — M79671 Pain in right foot: Secondary | ICD-10-CM

## 2024-02-22 NOTE — Telephone Encounter (Signed)
 Added note. Harden would lkie to see her sometime in Jan. 2026 for eval of right foot eval

## 2024-02-22 NOTE — Telephone Encounter (Signed)
 Called pt and left vm that Harden would like to see pt in office for right foot surgery eval

## 2024-02-22 NOTE — Progress Notes (Signed)
 Post-Op Visit Note   Patient: Krista Harris           Date of Birth: 04/13/1955           MRN: [id number] Visit Date: 02/22/2024 PCP: Shona Norleen PEDLAR, MD  Chief Complaint:  Chief Complaint  Patient presents with   Left Foot - Routine Post Op    01/20/2024 Weil osteotomy 2nd, 3rd and 4th MT    HPI:  HPI The patient is a 68 year old woman who is seen status post Weil osteotomy 2nd, 3rd and 4th metatarsals she has been in a postop shoe doing dry dressing changes Ortho Exam On examination left foot the incision is well-healed sutures are harvested there is no gaping drainage or erythema no edema  Visit Diagnoses: No diagnosis found.  Plan: She will advance weightbearing as tolerated in the postop shoe for 2 more weeks  Follow-Up Instructions: No follow-ups on file.   Imaging: No results found.  Orders:  No orders of the defined types were placed in this encounter.  No orders of the defined types were placed in this encounter.    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   Lumbar back pain with radiculopathy affecting left lower extremity 05/02/2020   Disorder of breast 04/17/2019   Malaise and fatigue 04/17/2019   Mood changes 04/17/2019   Rheumatoid arthritis (HCC) 04/17/2019   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   Dry mouth 11/10/2017   Irritable bowel syndrome 11/04/2017   Primary fibromyalgia syndrome 11/04/2017   Diarrhea 04/05/2017   History of colonic polyps 04/05/2017   Attention deficit hyperactivity disorder, 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   Social phobia 04/08/2014   Past Medical History:  Diagnosis Date   ADHD (attention deficit hyperactivity disorder) 11/04/2017   BMS (burning mouth syndrome)    klonopin   Erythromelalgia    asa   Fibromyalgia 11/04/2017   IBS (irritable bowel syndrome) 11/04/2017   hx - no current problems , no meds   Rheumatoid arthritis (HCC)     Family History  Problem Relation Age of Onset   High blood pressure Mother    High Cholesterol Mother    Congestive Heart Failure Father     Past Surgical History:  Procedure Laterality Date   AUGMENTATION MAMMAPLASTY     breast implants now removed   CARPOMETACARPEL SUSPENSION PLASTY Left 03/05/2021   Procedure: LEFT THUMB TRAPEZIECTOMY AND SUSPENSION PLASTY;  Surgeon: Murrell Drivers, MD;  Location: [facility];  Service: Orthopedics;  Laterality: Left;  Regional block   EYE SURGERY Bilateral 2024   cataracts   FOOT SURGERY Bilateral    for rheumatoid arthritis   FOOT SURGERY     x 2 - 2010 Left & 2011 right   HERNIA REPAIR     WEIL OSTEOTOMY Left 01/20/2024   Procedure: OSTEOTOMY, WEIL LEFT TWO, THREE, FOUR;  Surgeon: Harden Jerona GAILS, MD;  Location: [facility];  Service: Orthopedics;  Laterality: Left;  LEFT 2, 3, 4 WEIL OSTEOTOMY   WRIST SURGERY Left    Social History   Occupational History   Not on  file  Tobacco Use   Smoking status: Former    Types: Cigarettes   Smokeless tobacco: Never   Tobacco comments:    Quit smoking 08/2023 - on chantix   Vaping Use   Vaping status: Never Used  Substance and Sexual Activity   Alcohol use: No   Drug use: No   Sexual activity: Not Currently    Birth control/protection: Post-menopausal

## 2024-03-26 ENCOUNTER — Ambulatory Visit: Admitting: Orthopedic Surgery

## 2024-03-26 ENCOUNTER — Encounter: Payer: Self-pay | Admitting: Orthopedic Surgery

## 2024-03-26 DIAGNOSIS — M79672 Pain in left foot: Secondary | ICD-10-CM

## 2024-03-26 DIAGNOSIS — M79671 Pain in right foot: Secondary | ICD-10-CM

## 2024-03-26 DIAGNOSIS — M7742 Metatarsalgia, left foot: Secondary | ICD-10-CM

## 2024-03-26 NOTE — Progress Notes (Signed)
 "  Office Visit Note   Patient: Krista Harris           Date of Birth: [birth year]           MRN: [id number] Visit Date: 03/26/2024              Requested by: Shona Norleen PEDLAR, MD [address] [place],  [place] [address] PCP: Shona Norleen PEDLAR, MD  Chief Complaint  Patient presents with   Left Foot - Routine Post Op    01/20/2024 Weil osteotomy left foot 2nd, 3rd and 4th      HPI: Discussed the use of AI scribe software for clinical note transcription with the patient, who gave verbal consent to proceed.  History of Present Illness Krista Harris is a 69 year old female with longstanding rheumatoid arthritis and erythromelalgia who presents for follow-up of persistent and worsening pain and nodules in her feet and hands after left foot metatarsal osteotomies.  She has severe, persistent pain localized to the plantar aspect of the left foot, with multiple palpable, tender nodules that are exacerbated by ambulation. The pain is described as greater than prior to her recent Weil osteotomies of the second, third, and fourth metatarsals, with a sensation of tightness like a rod in the foot. She remains homebound due to pain, unable to walk her dog or shop for groceries, and is unable to wear socks due to erythromelalgia, which causes burning pain and erythema, especially after hot showers.  Despite the recent osteotomies intended to reduce overload on the ball of the foot, the original problem of painful nodules on the plantar surface persists and has not improved postoperatively. She has had prior surgeries on both feet, including two previous procedures involving plantar incisions, which provided relief for approximately fifteen years each time. Bilateral MRI scans have been performed, with images available on disc but not currently accessible.  She has a history of multiple soft tissue mass debridements and surgical incisions on the right foot. The third toe is described as  flopping and numb, and she is uncertain if further intervention would be beneficial. She notes that fluid accumulation in her feet sometimes causes lumps to protrude dorsally.  She has large, painful rheumatoid nodules involving her hands, significant pain, and loss of function. She has undergone two prior hand surgeries, including placement of a screw and rod, and experiences soreness and decreased sensation in her fingers, which interferes with daily tasks.  She is currently taking gabapentin three times daily, methotrexate, and Rinvoq, though she feels Rinvoq is not providing significant benefit. She has been off steroids since June 2025 and wishes to avoid restarting them, expressing a desire to discontinue pain medication altogether.  She expresses significant frustration with her current quality of life, stating she is unable to return to work, travel, or participate in social activities due to pain and disability. She has previously been evaluated for a soft tissue lump, which was suspected to be malignancy but ultimately confirmed as a rheumatoid nodule.     Assessment & Plan: Visit Diagnoses:  1. Metatarsalgia of left foot   2. Bilateral foot pain     Plan: Assessment and Plan Assessment & Plan Status post left metatarsal osteotomies Status post Weil osteotomies of the second, third, and fourth metatarsals of the left foot. Radiographs from December 3rd demonstrated stable osteotomies and good alignment. Persistent and worsening pain with palpable plantar nodules attributed to underlying rheumatoid arthritis. Surgical excision of plantar nodules carries  risk of increased pain due to scar tissue formation. Dorsal surgical approaches preferred to minimize symptomatic scar formation. - Reviewed left foot radiographs confirming stable osteotomies and good alignment. - Discussed that soft tissue nodules are not visualized on radiographs and may require MRI for further evaluation. -  Discussed risks of surgical excision of plantar nodules, including potential for increased pain due to scar tissue formation, and preference for dorsal approaches to minimize symptomatic scar formation. - Plan to review MRI of the foot if provided to assess for excisable nodules. - Scheduled follow-up in four weeks.  Rheumatoid arthritis with active nodules, hands and feet Longstanding rheumatoid arthritis with active, large nodules involving hands and feet, and ulnar deviation of the MCP joints. Ongoing inflammation contributes to nodule formation and joint damage. Current therapies are suboptimal, and she is considering clinical trials for new disease-modifying agents. Surgical excision of nodules may result in recurrence and/or increased pain due to scar tissue formation. - Encouraged participation in clinical trials for new disease-modifying agents, as current therapies are not fully effective. - Continued current medications (methotrexate, gabapentin, Rinvoq) pending further rheumatology evaluation. - Scheduled follow-up in four weeks. - Plan to review MRI of the foot for further assessment of soft tissue nodules if provided.  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 Radiographs confirm long second, third, and fourth metatarsals and dislocation of the third toe MTP joint. Surgical intervention would require metatarsal shortening and pinning, but may not restore normal alignment and could result in persistent deformity. Advised against surgical intervention due to limited expected benefit and risk of further complications. - Reviewed right foot radiographs confirming long second, third, and fourth metatarsals and dislocation of the third toe MTP joint. - Discussed that surgical intervention would involve metatarsal shortening and pinning, but may not restore  normal alignment and could result in persistent deformity. - Advised against surgical intervention at this time due to limited expected benefit and risk of further complications. - Scheduled follow-up in four weeks.      Follow-Up Instructions: Return in about 4 weeks (around 04/23/2024).   Ortho Exam  Patient is alert, oriented, no adenopathy, well-dressed, normal affect, normal respiratory effort. Physical Exam MUSCULOSKELETAL: Good congruence of metatarsal heads of left foot. Toes straight, flat, well aligned, no clawing. Soft tissue swelling across plantar aspect of foot. Active large rheumatoid nodules in hands with ulnar deviation of MCP joints.      Imaging: No results found. No images are attached to the encounter.  Labs: Lab Results  Component Value Date   ESRSEDRATE 19 05/06/2017   CRP <0.8 05/06/2017     Lab Results  Component Value Date   ALBUMIN 4.2 05/06/2017   ALBUMIN 4.2 03/23/2017   ALBUMIN 4.1 07/26/2016    No results found for: MG No results found for: VD25OH  No results found for: PREALBUMIN    Latest Ref Rng & Units 01/20/2024    7:09 AM 05/06/2017    2:03 PM 03/23/2017    3:42 PM  CBC EXTENDED  WBC 4.0 - 10.5 K/uL  5.0  6.8   RBC 3.87 - 5.11 MIL/uL  4.24  4.34   Hemoglobin 12.0 - 15.0 g/dL 86.0  86.2  85.6   HCT 36.0 - 46.0 % 41.0  43.1  45.0   Platelets 150 - 400 K/uL  270  331      There is  no height or weight on file to calculate BMI.  Orders:  No orders of the defined types were placed in this encounter.  No orders of the defined types were placed in this encounter.    Procedures: No procedures performed  Clinical Data: No additional findings.  ROS:  All other systems negative, except as noted in the HPI. Review of Systems  Objective: Vital Signs: There were no vitals taken for this visit.  Specialty Comments:  No specialty comments available.  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) 05/08/2021   Low back pain 01/06/2021   Macrocytosis 12/28/2020   Mixed hyperlipidemia 09/18/2020   Osteoporosis 09/18/2020   Megaloblastic anemia 09/18/2020   Irritable bowel syndrome with diarrhea 09/12/2020   Lumbar back pain with radiculopathy affecting left lower extremity 05/02/2020   Disorder of breast 04/17/2019   Malaise and fatigue 04/17/2019   Mood changes 04/17/2019   Rheumatoid arthritis (HCC) 04/17/2019   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   Dry mouth 11/10/2017   Irritable bowel syndrome 11/04/2017   Primary fibromyalgia syndrome 11/04/2017   Diarrhea 04/05/2017   History of colonic polyps 04/05/2017   Attention deficit hyperactivity disorder, 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   Social phobia 04/08/2014   Past Medical History:  Diagnosis Date   ADHD (attention deficit hyperactivity disorder) 11/04/2017   BMS (burning mouth syndrome)    klonopin   Erythromelalgia    asa   Fibromyalgia 11/04/2017   IBS (irritable bowel syndrome) 11/04/2017   hx - no current problems , no meds   Rheumatoid arthritis (HCC)     Family History  Problem Relation Age of Onset   High blood pressure Mother    High Cholesterol Mother    Congestive Heart Failure Father     Past Surgical History:  Procedure Laterality Date   AUGMENTATION MAMMAPLASTY     breast implants now removed   CARPOMETACARPEL SUSPENSION PLASTY Left 03/05/2021   Procedure: LEFT THUMB TRAPEZIECTOMY AND SUSPENSION PLASTY;  Surgeon: Murrell Drivers, MD;  Location: [facility];  Service: Orthopedics;  Laterality: Left;  Regional block   EYE SURGERY Bilateral 2024   cataracts   FOOT SURGERY Bilateral    for rheumatoid arthritis   FOOT SURGERY     x 2 - 2010 Left & 2011 right   HERNIA REPAIR      WEIL OSTEOTOMY Left 01/20/2024   Procedure: OSTEOTOMY, WEIL LEFT TWO, THREE, FOUR;  Surgeon: Harden Jerona GAILS, MD;  Location: [facility];  Service: Orthopedics;  Laterality: Left;  LEFT 2, 3, 4 WEIL OSTEOTOMY   WRIST SURGERY Left    Social History   Occupational History   Not on file  Tobacco Use   Smoking status: Former    Types: Cigarettes   Smokeless tobacco: Never   Tobacco comments:    Quit smoking 08/2023 - on chantix   Vaping Use   Vaping status: Never Used  Substance and Sexual Activity   Alcohol use: No   Drug use: No   Sexual activity: Not Currently    Birth control/protection: Post-menopausal         "

## 2024-04-06 ENCOUNTER — Other Ambulatory Visit (HOSPITAL_COMMUNITY): Payer: Self-pay

## 2024-04-06 DIAGNOSIS — Z87891 Personal history of nicotine dependence: Secondary | ICD-10-CM

## 2024-04-26 ENCOUNTER — Ambulatory Visit: Admitting: Orthopedic Surgery

## 2024-05-03 ENCOUNTER — Ambulatory Visit: Admitting: Orthopedic Surgery
